# Patient Record
Sex: Female | Born: 1935 | Race: White | Hispanic: No | Smoking: Never smoker
Health system: Southern US, Community
[De-identification: ages and names within clinical notes are randomized; demographics above are authoritative.]

## PROBLEM LIST (undated history)

## (undated) DIAGNOSIS — Z9289 Personal history of other medical treatment: Secondary | ICD-10-CM

## (undated) DIAGNOSIS — E039 Hypothyroidism, unspecified: Secondary | ICD-10-CM

## (undated) DIAGNOSIS — K219 Gastro-esophageal reflux disease without esophagitis: Secondary | ICD-10-CM

## (undated) DIAGNOSIS — E7800 Pure hypercholesterolemia, unspecified: Secondary | ICD-10-CM

## (undated) DIAGNOSIS — F419 Anxiety disorder, unspecified: Secondary | ICD-10-CM

## (undated) DIAGNOSIS — M1990 Unspecified osteoarthritis, unspecified site: Secondary | ICD-10-CM

## (undated) HISTORY — DX: Pure hypercholesterolemia, unspecified: E78.00

## (undated) HISTORY — PX: RECTAL SURGERY: SHX760

## (undated) HISTORY — PX: KNEE SURGERY: SHX244

## (undated) HISTORY — PX: JOINT REPLACEMENT: SHX530

## (undated) HISTORY — PX: RECTOCELE REPAIR: SHX761

## (undated) HISTORY — PX: BLADDER SUSPENSION: SHX72

## (undated) HISTORY — PX: SHOULDER ARTHROSCOPY: SHX128

## (undated) HISTORY — PX: EYE SURGERY: SHX253

---

## 1962-05-22 HISTORY — PX: VAGINAL HYSTERECTOMY: SUR661

## 1983-05-23 HISTORY — PX: BREAST BIOPSY: SHX20

## 2004-11-10 ENCOUNTER — Ambulatory Visit: Payer: Self-pay | Admitting: Internal Medicine

## 2005-10-24 ENCOUNTER — Ambulatory Visit: Payer: Self-pay | Admitting: Internal Medicine

## 2005-11-15 ENCOUNTER — Ambulatory Visit: Payer: Self-pay | Admitting: Unknown Physician Specialty

## 2005-12-07 ENCOUNTER — Ambulatory Visit: Payer: Self-pay | Admitting: Internal Medicine

## 2006-01-15 ENCOUNTER — Ambulatory Visit: Payer: Self-pay | Admitting: Gastroenterology

## 2006-02-14 ENCOUNTER — Ambulatory Visit: Payer: Self-pay | Admitting: Gastroenterology

## 2006-05-07 ENCOUNTER — Ambulatory Visit: Payer: Self-pay | Admitting: Internal Medicine

## 2006-12-13 ENCOUNTER — Ambulatory Visit: Payer: Self-pay | Admitting: Internal Medicine

## 2007-08-14 ENCOUNTER — Ambulatory Visit: Payer: Self-pay

## 2007-09-12 ENCOUNTER — Ambulatory Visit: Payer: Self-pay | Admitting: Orthopaedic Surgery

## 2007-09-12 ENCOUNTER — Other Ambulatory Visit: Payer: Self-pay

## 2007-09-19 ENCOUNTER — Ambulatory Visit: Payer: Self-pay | Admitting: Orthopaedic Surgery

## 2007-12-16 ENCOUNTER — Ambulatory Visit: Payer: Self-pay | Admitting: Internal Medicine

## 2008-01-16 ENCOUNTER — Ambulatory Visit: Payer: Self-pay | Admitting: Ophthalmology

## 2008-12-17 ENCOUNTER — Ambulatory Visit: Payer: Self-pay | Admitting: Internal Medicine

## 2009-12-20 ENCOUNTER — Ambulatory Visit: Payer: Self-pay | Admitting: Internal Medicine

## 2010-01-03 ENCOUNTER — Ambulatory Visit: Payer: Self-pay | Admitting: Unknown Physician Specialty

## 2010-02-02 ENCOUNTER — Ambulatory Visit: Payer: Self-pay | Admitting: Anesthesiology

## 2010-02-21 ENCOUNTER — Ambulatory Visit: Payer: Self-pay | Admitting: Anesthesiology

## 2010-03-30 ENCOUNTER — Ambulatory Visit: Payer: Self-pay | Admitting: Anesthesiology

## 2010-06-14 ENCOUNTER — Ambulatory Visit: Payer: Self-pay | Admitting: Otolaryngology

## 2010-10-05 ENCOUNTER — Ambulatory Visit: Payer: Self-pay | Admitting: Anesthesiology

## 2010-12-27 ENCOUNTER — Ambulatory Visit: Payer: Self-pay | Admitting: Internal Medicine

## 2010-12-27 ENCOUNTER — Ambulatory Visit: Payer: Self-pay | Admitting: Anesthesiology

## 2011-03-03 ENCOUNTER — Ambulatory Visit: Payer: Self-pay | Admitting: Anesthesiology

## 2011-04-04 ENCOUNTER — Ambulatory Visit: Payer: Self-pay | Admitting: Unknown Physician Specialty

## 2011-06-30 ENCOUNTER — Ambulatory Visit: Payer: Self-pay | Admitting: Anesthesiology

## 2011-10-10 ENCOUNTER — Ambulatory Visit: Payer: Self-pay | Admitting: Anesthesiology

## 2012-01-11 ENCOUNTER — Ambulatory Visit: Payer: Self-pay | Admitting: Internal Medicine

## 2012-02-07 ENCOUNTER — Emergency Department: Payer: Self-pay | Admitting: *Deleted

## 2012-02-07 LAB — CBC WITH DIFFERENTIAL/PLATELET
Basophil %: 0.7 %
Eosinophil #: 0.1 10*3/uL (ref 0.0–0.7)
Eosinophil %: 1.8 %
HCT: 40.5 % (ref 35.0–47.0)
HGB: 14 g/dL (ref 12.0–16.0)
Lymphocyte #: 1.6 10*3/uL (ref 1.0–3.6)
Lymphocyte %: 24.6 %
MCH: 31.8 pg (ref 26.0–34.0)
MCHC: 34.6 g/dL (ref 32.0–36.0)
MCV: 92 fL (ref 80–100)
Neutrophil #: 4.2 10*3/uL (ref 1.4–6.5)
Neutrophil %: 62.6 %
RBC: 4.42 10*6/uL (ref 3.80–5.20)

## 2012-02-07 LAB — COMPREHENSIVE METABOLIC PANEL
Albumin: 4.1 g/dL (ref 3.4–5.0)
Alkaline Phosphatase: 84 U/L (ref 50–136)
Anion Gap: 10 (ref 7–16)
Chloride: 95 mmol/L — ABNORMAL LOW (ref 98–107)
Co2: 30 mmol/L (ref 21–32)
Creatinine: 1.06 mg/dL (ref 0.60–1.30)
EGFR (African American): 57 — ABNORMAL LOW
Osmolality: 271 (ref 275–301)
Potassium: 3.2 mmol/L — ABNORMAL LOW (ref 3.5–5.1)
SGPT (ALT): 50 U/L (ref 12–78)
Sodium: 135 mmol/L — ABNORMAL LOW (ref 136–145)
Total Protein: 7.5 g/dL (ref 6.4–8.2)

## 2012-02-07 LAB — URINALYSIS, COMPLETE
Bacteria: NONE SEEN
Bilirubin,UR: NEGATIVE
Blood: NEGATIVE
Glucose,UR: NEGATIVE mg/dL (ref 0–75)
Ketone: NEGATIVE
Ph: 8 (ref 4.5–8.0)
RBC,UR: 1 /HPF (ref 0–5)
Specific Gravity: 1.011 (ref 1.003–1.030)
Squamous Epithelial: NONE SEEN

## 2012-03-26 ENCOUNTER — Ambulatory Visit: Payer: Self-pay | Admitting: General Practice

## 2012-03-26 DIAGNOSIS — I10 Essential (primary) hypertension: Secondary | ICD-10-CM

## 2012-03-26 LAB — APTT: Activated PTT: 30.2 secs (ref 23.6–35.9)

## 2012-03-26 LAB — CBC
MCH: 30.7 pg (ref 26.0–34.0)
MCHC: 33.9 g/dL (ref 32.0–36.0)
Platelet: 207 10*3/uL (ref 150–440)
RBC: 4.68 10*6/uL (ref 3.80–5.20)
RDW: 13.5 % (ref 11.5–14.5)

## 2012-03-26 LAB — BASIC METABOLIC PANEL
Anion Gap: 9 (ref 7–16)
BUN: 14 mg/dL (ref 7–18)
Calcium, Total: 9.4 mg/dL (ref 8.5–10.1)
EGFR (African American): >60
EGFR (Non-African Amer.): >60
Potassium: 3.6 mmol/L (ref 3.5–5.1)

## 2012-03-26 LAB — MRSA PCR SCREENING

## 2012-03-26 LAB — URINALYSIS, COMPLETE
Bilirubin,UR: NEGATIVE
Blood: NEGATIVE
Leukocyte Esterase: NEGATIVE
Nitrite: NEGATIVE
Protein: NEGATIVE
Specific Gravity: 1.008 (ref 1.003–1.030)

## 2012-03-26 LAB — PROTIME-INR: Prothrombin Time: 12.5 secs (ref 11.5–14.7)

## 2012-03-26 LAB — SEDIMENTATION RATE: Erythrocyte Sed Rate: 12 mm/hr (ref 0–30)

## 2012-04-08 ENCOUNTER — Inpatient Hospital Stay: Payer: Self-pay | Admitting: General Practice

## 2012-04-09 LAB — PLATELET COUNT: Platelet: 147 10*3/uL — ABNORMAL LOW (ref 150–440)

## 2012-04-09 LAB — BASIC METABOLIC PANEL
Anion Gap: 4 — ABNORMAL LOW (ref 7–16)
BUN: 8 mg/dL (ref 7–18)
Calcium, Total: 8.7 mg/dL (ref 8.5–10.1)
Chloride: 104 mmol/L (ref 98–107)
EGFR (Non-African Amer.): >60
Osmolality: 275 (ref 275–301)
Potassium: 3.3 mmol/L — ABNORMAL LOW (ref 3.5–5.1)

## 2012-04-10 LAB — BASIC METABOLIC PANEL
BUN: 6 mg/dL — ABNORMAL LOW (ref 7–18)
Chloride: 103 mmol/L (ref 98–107)
Creatinine: 0.62 mg/dL (ref 0.60–1.30)
EGFR (African American): >60
EGFR (Non-African Amer.): >60
Sodium: 137 mmol/L (ref 136–145)

## 2012-04-10 LAB — HEMOGLOBIN: HGB: 12.7 g/dL (ref 12.0–16.0)

## 2012-04-10 LAB — PLATELET COUNT: Platelet: 155 10*3/uL (ref 150–440)

## 2012-04-11 ENCOUNTER — Encounter: Payer: Self-pay | Admitting: Internal Medicine

## 2012-04-11 LAB — BASIC METABOLIC PANEL
Anion Gap: 4 — ABNORMAL LOW (ref 7–16)
BUN: 9 mg/dL (ref 7–18)
Calcium, Total: 8.8 mg/dL (ref 8.5–10.1)
Co2: 31 mmol/L (ref 21–32)
EGFR (African American): >60
EGFR (Non-African Amer.): >60
Glucose: 91 mg/dL (ref 65–99)
Osmolality: 276 (ref 275–301)
Sodium: 139 mmol/L (ref 136–145)

## 2012-04-21 ENCOUNTER — Encounter: Payer: Self-pay | Admitting: Internal Medicine

## 2012-08-07 ENCOUNTER — Ambulatory Visit: Payer: Self-pay | Admitting: Anesthesiology

## 2012-10-28 ENCOUNTER — Ambulatory Visit: Payer: Self-pay | Admitting: Anesthesiology

## 2013-01-13 ENCOUNTER — Ambulatory Visit: Payer: Self-pay | Admitting: Internal Medicine

## 2013-01-16 ENCOUNTER — Ambulatory Visit: Payer: Self-pay | Admitting: Anesthesiology

## 2013-03-18 ENCOUNTER — Ambulatory Visit: Payer: Self-pay | Admitting: Anesthesiology

## 2013-04-21 ENCOUNTER — Encounter: Payer: Self-pay | Admitting: Podiatry

## 2013-04-21 ENCOUNTER — Ambulatory Visit (INDEPENDENT_AMBULATORY_CARE_PROVIDER_SITE_OTHER): Payer: Medicare Other | Admitting: Podiatry

## 2013-04-21 VITALS — BP 116/59 | HR 77 | Resp 16 | Ht 62.0 in | Wt 146.0 lb

## 2013-04-21 DIAGNOSIS — B351 Tinea unguium: Secondary | ICD-10-CM

## 2013-04-21 DIAGNOSIS — L608 Other nail disorders: Secondary | ICD-10-CM

## 2013-04-21 NOTE — Progress Notes (Signed)
Pamela Moore is an 79 year old white female who presents today one suggestion of her hairstylist regarding a blue toenail hallux left and a thickening toenail to the hallux right. He states that these been present for the past 2-3 months now denies any trauma to the foot this seems to be worsening.  Objective: I have reviewed her past medical history her medications or allergies her review of systems. Vital signs are stable she is alert and oriented x3. Pulses remain palpable bilateral and neurologic sensorium is intact. Deep tendon reflexes are equal bilateral. Muscle strength is normal is 5 over 5 dorsiflexors plantar flexors inverters and evertors with all intrinsic musculature intact. Cutaneous evaluation demonstrates supple well hydrated cutis subungual hematoma left hallux. Mild discoloration to the very distal aspect of the nail plate hallux right probable nail dystrophy. Orthopedic evaluation demonstrates rectus foot type bilateral mild flexible hammertoe deformities noted bilateral.  Assessment: Subungual hematoma hallux left. Nail dystrophy hallux right.  Plan: Since samples of her nail and skin out today for a mycologic evaluation. The samples came from the hallux bilateral and I will followup with her once this come in.

## 2013-04-21 NOTE — Progress Notes (Signed)
N O L B/L GREAT TOENAILS D 2-42M O SLOWLY C WORSE A 0 T 0

## 2013-05-07 ENCOUNTER — Ambulatory Visit: Payer: Self-pay | Admitting: Anesthesiology

## 2013-05-16 ENCOUNTER — Telehealth: Payer: Self-pay | Admitting: Podiatry

## 2013-05-16 NOTE — Telephone Encounter (Signed)
PT CALLED QUESTION ABOUT HER TOENAIL LAB RESULT. NOTIFY THE PT. THE RESULT IS NOT IN YET, BUT WILL GIVE HER A CALL AS SOON WE RECEIVED THE RESULT.

## 2013-05-19 ENCOUNTER — Encounter: Payer: Self-pay | Admitting: Podiatry

## 2013-05-21 ENCOUNTER — Encounter: Payer: Self-pay | Admitting: Podiatry

## 2013-05-21 ENCOUNTER — Ambulatory Visit (INDEPENDENT_AMBULATORY_CARE_PROVIDER_SITE_OTHER): Payer: Medicare Other | Admitting: Podiatry

## 2013-05-21 VITALS — BP 131/74 | HR 93 | Resp 16 | Ht 61.0 in | Wt 145.0 lb

## 2013-05-21 DIAGNOSIS — Z79899 Other long term (current) drug therapy: Secondary | ICD-10-CM

## 2013-05-21 MED ORDER — EFINACONAZOLE 10 % EX SOLN: 1.0000 [drp] | Freq: Every day | CUTANEOUS | Status: DC

## 2013-05-21 NOTE — Progress Notes (Signed)
She presents today for followup of her lab results which did come back positive for small amount of fungus with a dystrophic hallux nail bilateral.  Objective: Vital signs are stable she is alert and oriented x3 onychomycosis to the hallux nail plates bilateral a subungual onychomycosis and subungual hematoma hallux left.  Assessment: Onychomycosis bilateral hallux.  Plan: Started her on Jublieea to be applied twice a day to each great nail and I will followup with her in several months

## 2013-06-24 ENCOUNTER — Telehealth: Payer: Self-pay | Admitting: *Deleted

## 2013-06-24 NOTE — Telephone Encounter (Signed)
Pt called said she had not receiver her refill for jublia. Pt said she had called pharmacy to request it but had not received it and that the pharmacy said that we needed to contact them. She had called the pharmacy again and this time asked the pharmacy to call us. Pharmacy contacted [place], but when i called them back it was not [facility], it was a pharmacy in [place]. I then contacted [facility] and asked them to send refill to pt. They said they would do that. I then called the pt back and informed her she had contacted the wrong pharmacy and gave her the correct information to [facility] and told her that they will be sending her refill to her. Pt understood.

## 2013-07-22 ENCOUNTER — Encounter: Payer: Self-pay | Admitting: *Deleted

## 2013-07-22 ENCOUNTER — Telehealth: Payer: Self-pay | Admitting: *Deleted

## 2013-07-22 NOTE — Telephone Encounter (Signed)
Called and spoke with pt letting her know dr Milinda Pointer said one pill per day of the jublia was fine. Pt understood.

## 2013-07-22 NOTE — Telephone Encounter (Signed)
Open in error

## 2013-07-22 NOTE — Telephone Encounter (Signed)
Pt called said you had given her jublia. Said she was using it twice daily and she began to itch all over. Stated she cut it back to once daily and everything seems to be doing well. She wants to know if this is ok and is this  going to be effective?

## 2013-07-22 NOTE — Telephone Encounter (Signed)
That will be fine. 

## 2013-09-01 ENCOUNTER — Ambulatory Visit: Payer: Self-pay | Admitting: Anesthesiology

## 2013-09-16 ENCOUNTER — Other Ambulatory Visit: Payer: Self-pay | Admitting: *Deleted

## 2013-09-16 ENCOUNTER — Telehealth: Payer: Self-pay | Admitting: *Deleted

## 2013-09-16 MED ORDER — EFINACONAZOLE 10 % EX SOLN: 1.0000 [drp] | Freq: Every day | CUTANEOUS | Status: DC

## 2013-09-16 NOTE — Telephone Encounter (Signed)
Called patient to let her know i did go ahead and refilled the jublia with another pharmacy let us know if she does not hear from them

## 2013-09-16 NOTE — Telephone Encounter (Signed)
Needed a refill for jublia

## 2013-09-18 ENCOUNTER — Ambulatory Visit: Payer: Self-pay | Admitting: Anesthesiology

## 2013-11-17 ENCOUNTER — Ambulatory Visit: Payer: Medicare Other | Admitting: Podiatry

## 2013-11-26 ENCOUNTER — Ambulatory Visit: Payer: Self-pay | Admitting: Anesthesiology

## 2013-11-28 DIAGNOSIS — M519 Unspecified thoracic, thoracolumbar and lumbosacral intervertebral disc disorder: Secondary | ICD-10-CM | POA: Insufficient documentation

## 2013-12-07 ENCOUNTER — Emergency Department: Payer: Self-pay | Admitting: Emergency Medicine

## 2013-12-07 LAB — COMPREHENSIVE METABOLIC PANEL
ALBUMIN: 3.7 g/dL (ref 3.4–5.0)
Alkaline Phosphatase: 103 U/L
Anion Gap: 7 (ref 7–16)
BILIRUBIN TOTAL: 0.4 mg/dL (ref 0.2–1.0)
BUN: 14 mg/dL (ref 7–18)
CALCIUM: 9.1 mg/dL (ref 8.5–10.1)
CO2: 31 mmol/L (ref 21–32)
Chloride: 97 mmol/L — ABNORMAL LOW (ref 98–107)
Creatinine: 0.85 mg/dL (ref 0.60–1.30)
EGFR (Non-African Amer.): >60
GLUCOSE: 95 mg/dL (ref 65–99)
Osmolality: 270 (ref 275–301)
POTASSIUM: 3.1 mmol/L — AB (ref 3.5–5.1)
SGOT(AST): 42 U/L — ABNORMAL HIGH (ref 15–37)
SGPT (ALT): 38 U/L (ref 12–78)
Sodium: 135 mmol/L — ABNORMAL LOW (ref 136–145)
Total Protein: 7.3 g/dL (ref 6.4–8.2)

## 2013-12-07 LAB — DRUG SCREEN, URINE
Amphetamines, Ur Screen: NEGATIVE (ref ?–1000)
Barbiturates, Ur Screen: NEGATIVE (ref ?–200)
Benzodiazepine, Ur Scrn: NEGATIVE (ref ?–200)
COCAINE METABOLITE, UR ~~LOC~~: NEGATIVE (ref ?–300)
Cannabinoid 50 Ng, Ur ~~LOC~~: NEGATIVE (ref ?–50)
MDMA (ECSTASY) UR SCREEN: NEGATIVE (ref ?–500)
METHADONE, UR SCREEN: NEGATIVE (ref ?–300)
OPIATE, UR SCREEN: NEGATIVE (ref ?–300)
Phencyclidine (PCP) Ur S: NEGATIVE (ref ?–25)
TRICYCLIC, UR SCREEN: NEGATIVE (ref ?–1000)

## 2013-12-07 LAB — ETHANOL: Ethanol %: <0.003 % (ref 0.000–0.080)

## 2013-12-07 LAB — TROPONIN I
Troponin-I: 0.06 ng/mL — ABNORMAL HIGH
Troponin-I: 0.06 ng/mL — ABNORMAL HIGH

## 2013-12-07 LAB — URINALYSIS, COMPLETE
BLOOD: NEGATIVE
Bacteria: NONE SEEN
Bilirubin,UR: NEGATIVE
GLUCOSE, UR: NEGATIVE mg/dL (ref 0–75)
Ketone: NEGATIVE
LEUKOCYTE ESTERASE: NEGATIVE
Nitrite: NEGATIVE
PH: 7 (ref 4.5–8.0)
PROTEIN: NEGATIVE
RBC, UR: NONE SEEN /HPF (ref 0–5)
Specific Gravity: 1.003 (ref 1.003–1.030)
Squamous Epithelial: NONE SEEN

## 2013-12-07 LAB — CBC
HCT: 41.6 % (ref 35.0–47.0)
HGB: 13.5 g/dL (ref 12.0–16.0)
MCH: 29.2 pg (ref 26.0–34.0)
MCHC: 32.4 g/dL (ref 32.0–36.0)
MCV: 90 fL (ref 80–100)
PLATELETS: 236 10*3/uL (ref 150–440)
RBC: 4.61 10*6/uL (ref 3.80–5.20)
RDW: 15.1 % — ABNORMAL HIGH (ref 11.5–14.5)
WBC: 6.1 10*3/uL (ref 3.6–11.0)

## 2014-03-12 ENCOUNTER — Ambulatory Visit: Payer: Self-pay | Admitting: Internal Medicine

## 2014-06-02 ENCOUNTER — Other Ambulatory Visit (HOSPITAL_COMMUNITY): Payer: Self-pay | Admitting: Neurosurgery

## 2014-06-22 ENCOUNTER — Encounter (HOSPITAL_COMMUNITY): Payer: Self-pay

## 2014-06-22 ENCOUNTER — Encounter (HOSPITAL_COMMUNITY)
Admission: RE | Admit: 2014-06-22 | Discharge: 2014-06-22 | Disposition: A | Discharge status: Home / Self Care | Payer: PPO | Source: Ambulatory Visit | Attending: Neurosurgery | Admitting: Neurosurgery

## 2014-06-22 DIAGNOSIS — M4806 Spinal stenosis, lumbar region: Secondary | ICD-10-CM | POA: Diagnosis not present

## 2014-06-22 DIAGNOSIS — Z01812 Encounter for preprocedural laboratory examination: Secondary | ICD-10-CM | POA: Insufficient documentation

## 2014-06-22 DIAGNOSIS — Z0183 Encounter for blood typing: Secondary | ICD-10-CM | POA: Diagnosis not present

## 2014-06-22 HISTORY — DX: Hypothyroidism, unspecified: E03.9

## 2014-06-22 HISTORY — DX: Anxiety disorder, unspecified: F41.9

## 2014-06-22 HISTORY — DX: Gastro-esophageal reflux disease without esophagitis: K21.9

## 2014-06-22 HISTORY — DX: Personal history of other medical treatment: Z92.89

## 2014-06-22 HISTORY — DX: Unspecified osteoarthritis, unspecified site: M19.90

## 2014-06-22 LAB — TYPE AND SCREEN
ABO/RH(D): A POS
Antibody Screen: NEGATIVE

## 2014-06-22 LAB — ABO/RH: ABO/RH(D): A POS

## 2014-06-22 LAB — CBC
HEMATOCRIT: 40.9 % (ref 36.0–46.0)
Hemoglobin: 14 g/dL (ref 12.0–15.0)
MCH: 30.4 pg (ref 26.0–34.0)
MCHC: 34.2 g/dL (ref 30.0–36.0)
MCV: 88.9 fL (ref 78.0–100.0)
PLATELETS: 216 10*3/uL (ref 150–400)
RBC: 4.6 MIL/uL (ref 3.87–5.11)
RDW: 12.7 % (ref 11.5–15.5)
WBC: 6.4 10*3/uL (ref 4.0–10.5)

## 2014-06-22 LAB — SURGICAL PCR SCREEN
MRSA, PCR: NEGATIVE
Staphylococcus aureus: NEGATIVE

## 2014-06-22 LAB — BASIC METABOLIC PANEL
Anion gap: 8 (ref 5–15)
BUN: 13 mg/dL (ref 6–23)
CALCIUM: 9.6 mg/dL (ref 8.4–10.5)
CO2: 29 mmol/L (ref 19–32)
Chloride: 98 mmol/L (ref 96–112)
Creatinine, Ser: 0.91 mg/dL (ref 0.50–1.10)
GFR calc non Af Amer: 56 mL/min — ABNORMAL LOW (ref >90)
GFR, EST AFRICAN AMERICAN: 65 mL/min — AB (ref >90)
Glucose, Bld: 96 mg/dL (ref 70–99)
Potassium: 3.6 mmol/L (ref 3.5–5.1)
Sodium: 135 mmol/L (ref 135–145)

## 2014-06-22 NOTE — Pre-Procedure Instructions (Addendum)
Pamela Moore  06/22/2014   Your procedure is scheduled on: Monday, February 8.  Report to [facility] Admitting at 5:30AM.  Call this number if you have problems the morning of surgery: [phone]                For any other questions, please call [phone], Monday - Friday 8 AM - 4 PM.  Remember:   Do not eat food or drink liquids after midnight Sunday, February 7.   Take these medicines the morning of surgery with A SIP OF WATER: levothyroxine (SYNTHROID, LEVOTHROID), omeprazole (PRILOSEC).               Stop taking Aspirin, Coumadin, Plavix, Effient and Herbal medications and Vitamins.  Do not take any NSAIDs PV:VZSMOLMBE (MOBIC,  Ibuprofen,  Advil,Naproxen or any medication containing Aspirin.    Do not wear jewelry, make-up or nail polish.  Do not wear lotions, powders, or perfumes.  Do not shave 48 hours prior to surgery.  Do not bring valuables to the hospital.             [facility] is not responsible for any belongings or valuables.               Contacts, dentures or bridgework may not be worn into surgery.  Leave suitcase in the car. After surgery it may be brought to your room.  For patients admitted to the hospital, discharge time is determined by your treatment team.              Special Instructions: .Review  [facility] - Preparing For Surgery.   Please read over the following fact sheets that you were given: Pain Booklet, Coughing and Deep Breathing, Blood Transfusion Information and MRSA Information

## 2014-06-28 MED ORDER — CEFAZOLIN SODIUM-DEXTROSE 2-3 GM-% IV SOLR
2.0000 g | INTRAVENOUS | Status: DC
  Filled 2014-06-28: qty 50

## 2014-06-29 ENCOUNTER — Inpatient Hospital Stay (HOSPITAL_COMMUNITY)
Admission: RE | Admit: 2014-06-29 | Discharge: 2014-07-01 | DRG: 460 | Disposition: A | Discharge status: Skilled Nursing Facility | Payer: PPO | Source: Ambulatory Visit | Attending: Neurosurgery | Admitting: Neurosurgery

## 2014-06-29 ENCOUNTER — Inpatient Hospital Stay (HOSPITAL_COMMUNITY): Payer: PPO | Admitting: Anesthesiology

## 2014-06-29 ENCOUNTER — Encounter (HOSPITAL_COMMUNITY): Payer: Self-pay | Admitting: *Deleted

## 2014-06-29 ENCOUNTER — Inpatient Hospital Stay (HOSPITAL_COMMUNITY): Payer: PPO

## 2014-06-29 ENCOUNTER — Encounter (HOSPITAL_COMMUNITY)
Admission: RE | Disposition: A | Discharge status: Skilled Nursing Facility | Payer: Medicare Other | Source: Ambulatory Visit | Attending: Neurosurgery

## 2014-06-29 DIAGNOSIS — M4316 Spondylolisthesis, lumbar region: Principal | ICD-10-CM | POA: Diagnosis present

## 2014-06-29 DIAGNOSIS — E780 Pure hypercholesterolemia: Secondary | ICD-10-CM | POA: Diagnosis present

## 2014-06-29 DIAGNOSIS — Z7982 Long term (current) use of aspirin: Secondary | ICD-10-CM | POA: Diagnosis not present

## 2014-06-29 DIAGNOSIS — E039 Hypothyroidism, unspecified: Secondary | ICD-10-CM | POA: Diagnosis present

## 2014-06-29 DIAGNOSIS — F419 Anxiety disorder, unspecified: Secondary | ICD-10-CM | POA: Diagnosis present

## 2014-06-29 DIAGNOSIS — M48062 Spinal stenosis, lumbar region with neurogenic claudication: Secondary | ICD-10-CM | POA: Diagnosis present

## 2014-06-29 DIAGNOSIS — M549 Dorsalgia, unspecified: Secondary | ICD-10-CM | POA: Diagnosis present

## 2014-06-29 DIAGNOSIS — M4806 Spinal stenosis, lumbar region: Secondary | ICD-10-CM | POA: Diagnosis present

## 2014-06-29 DIAGNOSIS — K219 Gastro-esophageal reflux disease without esophagitis: Secondary | ICD-10-CM | POA: Diagnosis present

## 2014-06-29 DIAGNOSIS — M5416 Radiculopathy, lumbar region: Secondary | ICD-10-CM | POA: Diagnosis present

## 2014-06-29 DIAGNOSIS — M48061 Spinal stenosis, lumbar region without neurogenic claudication: Secondary | ICD-10-CM

## 2014-06-29 SURGERY — POSTERIOR LUMBAR FUSION 1 LEVEL
Anesthesia: General | Site: Spine Lumbar

## 2014-06-29 MED ORDER — FLUTICASONE PROPIONATE 50 MCG/ACT NA SUSP
2.0000 | Freq: Every day | NASAL | Status: DC
Start: 2014-06-29 — End: 2014-07-01
  Administered 2014-06-29 – 2014-07-01 (×3): 2 via NASAL
  Filled 2014-06-29: qty 16

## 2014-06-29 MED ORDER — KETOROLAC TROMETHAMINE 15 MG/ML IJ SOLN
INTRAMUSCULAR | Status: AC
  Filled 2014-06-29: qty 1

## 2014-06-29 MED ORDER — FENTANYL CITRATE 0.05 MG/ML IJ SOLN
INTRAMUSCULAR | Status: DC | PRN
  Administered 2014-06-29 (×3): 50 ug via INTRAVENOUS
  Administered 2014-06-29: 25 ug via INTRAVENOUS
  Administered 2014-06-29 (×3): 50 ug via INTRAVENOUS
  Administered 2014-06-29: 25 ug via INTRAVENOUS

## 2014-06-29 MED ORDER — HYDROMORPHONE HCL 1 MG/ML IJ SOLN: 0.2500 mg | INTRAMUSCULAR | Status: DC | PRN

## 2014-06-29 MED ORDER — ONDANSETRON HCL 4 MG/2ML IJ SOLN: 4.0000 mg | Freq: Once | INTRAMUSCULAR | Status: DC | PRN

## 2014-06-29 MED ORDER — DOCUSATE SODIUM 100 MG PO CAPS
100.0000 mg | ORAL_CAPSULE | Freq: Two times a day (BID) | ORAL | Status: DC
  Administered 2014-06-29 – 2014-07-01 (×4): 100 mg via ORAL
  Filled 2014-06-29 (×4): qty 1

## 2014-06-29 MED ORDER — GLYCOPYRROLATE 0.2 MG/ML IJ SOLN
INTRAMUSCULAR | Status: DC | PRN
  Administered 2014-06-29: 0.3 mg via INTRAVENOUS

## 2014-06-29 MED ORDER — ROCURONIUM BROMIDE 100 MG/10ML IV SOLN
INTRAVENOUS | Status: DC | PRN
  Administered 2014-06-29: 50 mg via INTRAVENOUS

## 2014-06-29 MED ORDER — ALUM & MAG HYDROXIDE-SIMETH 200-200-20 MG/5ML PO SUSP: 30.0000 mL | Freq: Four times a day (QID) | ORAL | Status: DC | PRN

## 2014-06-29 MED ORDER — LABETALOL HCL 5 MG/ML IV SOLN
5.0000 mg | Freq: Once | INTRAVENOUS | Status: AC
  Administered 2014-06-29: 5 mg via INTRAVENOUS

## 2014-06-29 MED ORDER — FENTANYL CITRATE 0.05 MG/ML IJ SOLN
INTRAMUSCULAR | Status: AC
Start: 2014-06-29 — End: 2014-06-29
  Filled 2014-06-29: qty 5

## 2014-06-29 MED ORDER — PHENYLEPHRINE HCL 10 MG/ML IJ SOLN
INTRAMUSCULAR | Status: DC | PRN
  Administered 2014-06-29 (×5): 40 ug via INTRAVENOUS

## 2014-06-29 MED ORDER — LABETALOL HCL 5 MG/ML IV SOLN
INTRAVENOUS | Status: AC
  Administered 2014-06-29: 5 mg via INTRAVENOUS
  Filled 2014-06-29: qty 4

## 2014-06-29 MED ORDER — SIMVASTATIN 40 MG PO TABS
40.0000 mg | ORAL_TABLET | Freq: Every day | ORAL | Status: DC
  Administered 2014-06-29 – 2014-06-30 (×2): 40 mg via ORAL
  Filled 2014-06-29 (×3): qty 1

## 2014-06-29 MED ORDER — FENTANYL CITRATE 0.05 MG/ML IJ SOLN
INTRAMUSCULAR | Status: AC
  Filled 2014-06-29: qty 5

## 2014-06-29 MED ORDER — HYDROCODONE-ACETAMINOPHEN 5-325 MG PO TABS
1.0000 | ORAL_TABLET | ORAL | Status: DC | PRN
  Administered 2014-06-29 – 2014-06-30 (×4): 2 via ORAL
  Administered 2014-07-01: 1 via ORAL
  Filled 2014-06-29 (×4): qty 2
  Filled 2014-06-29: qty 1

## 2014-06-29 MED ORDER — SODIUM CHLORIDE 0.9 % IJ SOLN: 3.0000 mL | INTRAMUSCULAR | Status: DC | PRN

## 2014-06-29 MED ORDER — NEOSTIGMINE METHYLSULFATE 10 MG/10ML IV SOLN
INTRAVENOUS | Status: DC | PRN
Start: 2014-06-29 — End: 2014-06-29
  Administered 2014-06-29: 1 mg via INTRAVENOUS
  Administered 2014-06-29: 2 mg via INTRAVENOUS

## 2014-06-29 MED ORDER — MENTHOL 3 MG MT LOZG: 1.0000 | LOZENGE | OROMUCOSAL | Status: DC | PRN

## 2014-06-29 MED ORDER — THROMBIN 20000 UNITS EX SOLR
CUTANEOUS | Status: DC | PRN
  Administered 2014-06-29: 09:00:00 via TOPICAL

## 2014-06-29 MED ORDER — ACETAMINOPHEN 650 MG RE SUPP: 650.0000 mg | RECTAL | Status: DC | PRN

## 2014-06-29 MED ORDER — BISACODYL 10 MG RE SUPP: 10.0000 mg | Freq: Every day | RECTAL | Status: DC | PRN

## 2014-06-29 MED ORDER — MAGNESIUM HYDROXIDE 400 MG/5ML PO SUSP: 30.0000 mL | Freq: Every day | ORAL | Status: DC | PRN

## 2014-06-29 MED ORDER — HYDROMORPHONE HCL 1 MG/ML IJ SOLN
0.2500 mg | INTRAMUSCULAR | Status: DC | PRN
  Administered 2014-06-29 (×2): 0.5 mg via INTRAVENOUS

## 2014-06-29 MED ORDER — KCL IN DEXTROSE-NACL 20-5-0.45 MEQ/L-%-% IV SOLN
INTRAVENOUS | Status: DC
  Filled 2014-06-29 (×6): qty 1000

## 2014-06-29 MED ORDER — ONDANSETRON HCL 4 MG/2ML IJ SOLN: 4.0000 mg | Freq: Four times a day (QID) | INTRAMUSCULAR | Status: DC | PRN

## 2014-06-29 MED ORDER — THROMBIN 5000 UNITS EX SOLR
OROMUCOSAL | Status: DC | PRN
  Administered 2014-06-29: 09:00:00 via TOPICAL

## 2014-06-29 MED ORDER — 0.9 % SODIUM CHLORIDE (POUR BTL) OPTIME
TOPICAL | Status: DC | PRN
  Administered 2014-06-29: 1000 mL

## 2014-06-29 MED ORDER — ARTIFICIAL TEARS OP OINT
TOPICAL_OINTMENT | OPHTHALMIC | Status: DC | PRN
  Administered 2014-06-29: 1 via OPHTHALMIC

## 2014-06-29 MED ORDER — ROCURONIUM BROMIDE 50 MG/5ML IV SOLN
INTRAVENOUS | Status: AC
  Filled 2014-06-29: qty 1

## 2014-06-29 MED ORDER — HYDROMORPHONE HCL 1 MG/ML IJ SOLN
INTRAMUSCULAR | Status: AC
  Administered 2014-06-29: 0.5 mg via INTRAVENOUS
  Filled 2014-06-29: qty 1

## 2014-06-29 MED ORDER — ARTIFICIAL TEARS OP OINT
TOPICAL_OINTMENT | OPHTHALMIC | Status: AC
  Filled 2014-06-29: qty 3.5

## 2014-06-29 MED ORDER — ONDANSETRON HCL 4 MG/2ML IJ SOLN
INTRAMUSCULAR | Status: DC | PRN
  Administered 2014-06-29: 4 mg via INTRAVENOUS

## 2014-06-29 MED ORDER — CEFAZOLIN SODIUM-DEXTROSE 2-3 GM-% IV SOLR
INTRAVENOUS | Status: AC
  Filled 2014-06-29: qty 50

## 2014-06-29 MED ORDER — CEFAZOLIN SODIUM-DEXTROSE 2-3 GM-% IV SOLR
INTRAVENOUS | Status: DC | PRN
  Administered 2014-06-29 (×2): 2 g via INTRAVENOUS

## 2014-06-29 MED ORDER — OXYCODONE-ACETAMINOPHEN 5-325 MG PO TABS
1.0000 | ORAL_TABLET | ORAL | Status: DC | PRN
  Administered 2014-06-29: 2 via ORAL

## 2014-06-29 MED ORDER — SODIUM CHLORIDE 0.9 % IR SOLN
Status: DC | PRN
  Administered 2014-06-29: 09:00:00

## 2014-06-29 MED ORDER — GLYCOPYRROLATE 0.2 MG/ML IJ SOLN
INTRAMUSCULAR | Status: AC
  Filled 2014-06-29: qty 2

## 2014-06-29 MED ORDER — DEXTROSE 5 % IV SOLN
10.0000 mg | INTRAVENOUS | Status: DC | PRN
  Administered 2014-06-29: 10 ug/min via INTRAVENOUS

## 2014-06-29 MED ORDER — NEOSTIGMINE METHYLSULFATE 10 MG/10ML IV SOLN
INTRAVENOUS | Status: AC
  Filled 2014-06-29: qty 1

## 2014-06-29 MED ORDER — PROPOFOL 10 MG/ML IV BOLUS
INTRAVENOUS | Status: DC | PRN
  Administered 2014-06-29: 100 mg via INTRAVENOUS

## 2014-06-29 MED ORDER — TRIAMTERENE-HCTZ 37.5-25 MG PO TABS
1.0000 | ORAL_TABLET | Freq: Every day | ORAL | Status: DC
  Administered 2014-06-29 – 2014-07-01 (×3): 1 via ORAL
  Filled 2014-06-29 (×3): qty 1

## 2014-06-29 MED ORDER — LEVOTHYROXINE SODIUM 50 MCG PO TABS
50.0000 ug | ORAL_TABLET | Freq: Every day | ORAL | Status: DC
  Administered 2014-06-30 – 2014-07-01 (×2): 50 ug via ORAL
  Filled 2014-06-29 (×4): qty 1

## 2014-06-29 MED ORDER — CYCLOBENZAPRINE HCL 10 MG PO TABS
10.0000 mg | ORAL_TABLET | Freq: Three times a day (TID) | ORAL | Status: DC | PRN
  Administered 2014-06-30: 10 mg via ORAL
  Filled 2014-06-29: qty 1

## 2014-06-29 MED ORDER — PHENYLEPHRINE 40 MCG/ML (10ML) SYRINGE FOR IV PUSH (FOR BLOOD PRESSURE SUPPORT)
PREFILLED_SYRINGE | INTRAVENOUS | Status: AC
  Filled 2014-06-29: qty 10

## 2014-06-29 MED ORDER — SUCCINYLCHOLINE CHLORIDE 20 MG/ML IJ SOLN
INTRAMUSCULAR | Status: AC
  Filled 2014-06-29: qty 1

## 2014-06-29 MED ORDER — DONEPEZIL HCL 10 MG PO TABS
10.0000 mg | ORAL_TABLET | Freq: Every day | ORAL | Status: DC
  Administered 2014-06-29 – 2014-06-30 (×2): 10 mg via ORAL
  Filled 2014-06-29 (×3): qty 1

## 2014-06-29 MED ORDER — ACETAMINOPHEN 10 MG/ML IV SOLN
INTRAVENOUS | Status: AC
Start: 2014-06-29 — End: 2014-06-29
  Administered 2014-06-29: 1000 mg via INTRAVENOUS
  Filled 2014-06-29: qty 100

## 2014-06-29 MED ORDER — EPHEDRINE SULFATE 50 MG/ML IJ SOLN
INTRAMUSCULAR | Status: AC
  Filled 2014-06-29: qty 1

## 2014-06-29 MED ORDER — TEMAZEPAM 15 MG PO CAPS: 15.0000 mg | ORAL_CAPSULE | Freq: Every day | ORAL | Status: DC

## 2014-06-29 MED ORDER — VECURONIUM BROMIDE 10 MG IV SOLR
INTRAVENOUS | Status: AC
  Filled 2014-06-29: qty 10

## 2014-06-29 MED ORDER — SODIUM CHLORIDE 0.9 % IJ SOLN
INTRAMUSCULAR | Status: AC
  Filled 2014-06-29: qty 10

## 2014-06-29 MED ORDER — BUPIVACAINE HCL (PF) 0.5 % IJ SOLN
INTRAMUSCULAR | Status: DC | PRN
  Administered 2014-06-29: 16.5 mL

## 2014-06-29 MED ORDER — ONDANSETRON HCL 4 MG/2ML IJ SOLN
INTRAMUSCULAR | Status: AC
  Filled 2014-06-29: qty 2

## 2014-06-29 MED ORDER — SERTRALINE HCL 50 MG PO TABS
50.0000 mg | ORAL_TABLET | Freq: Every day | ORAL | Status: DC
Start: 2014-06-29 — End: 2014-07-01
  Administered 2014-06-29 – 2014-06-30 (×2): 50 mg via ORAL
  Filled 2014-06-29 (×3): qty 1

## 2014-06-29 MED ORDER — LIDOCAINE-EPINEPHRINE 1 %-1:100000 IJ SOLN
INTRAMUSCULAR | Status: DC | PRN
  Administered 2014-06-29: 16.5 mL

## 2014-06-29 MED ORDER — ACETAMINOPHEN 325 MG PO TABS
650.0000 mg | ORAL_TABLET | ORAL | Status: DC | PRN
  Administered 2014-06-30 – 2014-07-01 (×3): 650 mg via ORAL
  Filled 2014-06-29 (×3): qty 2

## 2014-06-29 MED ORDER — LACTATED RINGERS IV SOLN
INTRAVENOUS | Status: DC | PRN
  Administered 2014-06-29 (×2): via INTRAVENOUS

## 2014-06-29 MED ORDER — MIDAZOLAM HCL 5 MG/5ML IJ SOLN
INTRAMUSCULAR | Status: DC | PRN
  Administered 2014-06-29 (×2): 1 mg via INTRAVENOUS

## 2014-06-29 MED ORDER — PROPOFOL 10 MG/ML IV BOLUS
INTRAVENOUS | Status: AC
  Filled 2014-06-29: qty 20

## 2014-06-29 MED ORDER — LIDOCAINE HCL (CARDIAC) 20 MG/ML IV SOLN
INTRAVENOUS | Status: DC | PRN
  Administered 2014-06-29: 100 mg via INTRAVENOUS

## 2014-06-29 MED ORDER — ZOLPIDEM TARTRATE 5 MG PO TABS: 5.0000 mg | ORAL_TABLET | Freq: Every evening | ORAL | Status: DC | PRN

## 2014-06-29 MED ORDER — HYDROXYZINE HCL 25 MG PO TABS
50.0000 mg | ORAL_TABLET | ORAL | Status: DC | PRN
  Administered 2014-07-01: 50 mg via ORAL
  Filled 2014-06-29: qty 2

## 2014-06-29 MED ORDER — STERILE WATER FOR INJECTION IJ SOLN
INTRAMUSCULAR | Status: AC
  Filled 2014-06-29: qty 10

## 2014-06-29 MED ORDER — SODIUM CHLORIDE 0.9 % IJ SOLN
3.0000 mL | Freq: Two times a day (BID) | INTRAMUSCULAR | Status: DC
  Administered 2014-06-29 – 2014-06-30 (×2): 3 mL via INTRAVENOUS

## 2014-06-29 MED ORDER — LIDOCAINE HCL (CARDIAC) 20 MG/ML IV SOLN
INTRAVENOUS | Status: AC
  Filled 2014-06-29: qty 5

## 2014-06-29 MED ORDER — KETOROLAC TROMETHAMINE 30 MG/ML IJ SOLN
15.0000 mg | Freq: Four times a day (QID) | INTRAMUSCULAR | Status: AC
  Administered 2014-06-29 – 2014-06-30 (×5): 15 mg via INTRAVENOUS
  Filled 2014-06-29 (×7): qty 1

## 2014-06-29 MED ORDER — KETOROLAC TROMETHAMINE 30 MG/ML IJ SOLN
15.0000 mg | Freq: Once | INTRAMUSCULAR | Status: AC
  Administered 2014-06-29: 15 mg via INTRAVENOUS

## 2014-06-29 MED ORDER — MEPERIDINE HCL 25 MG/ML IJ SOLN: 6.2500 mg | INTRAMUSCULAR | Status: DC | PRN

## 2014-06-29 MED ORDER — SODIUM CHLORIDE 0.9 % IV SOLN: 250.0000 mL | INTRAVENOUS | Status: DC

## 2014-06-29 MED ORDER — PHENOL 1.4 % MT LIQD
1.0000 | OROMUCOSAL | Status: DC | PRN
Start: 2014-06-29 — End: 2014-07-01

## 2014-06-29 MED ORDER — OXYCODONE-ACETAMINOPHEN 5-325 MG PO TABS
ORAL_TABLET | ORAL | Status: AC
  Administered 2014-06-29: 2 via ORAL
  Filled 2014-06-29: qty 2

## 2014-06-29 MED ORDER — CYCLOBENZAPRINE HCL 10 MG PO TABS
ORAL_TABLET | ORAL | Status: AC
  Filled 2014-06-29: qty 1

## 2014-06-29 MED ORDER — MORPHINE SULFATE 4 MG/ML IJ SOLN: 4.0000 mg | INTRAMUSCULAR | Status: DC | PRN

## 2014-06-29 MED ORDER — MIDAZOLAM HCL 2 MG/2ML IJ SOLN
INTRAMUSCULAR | Status: AC
  Filled 2014-06-29: qty 2

## 2014-06-29 MED ORDER — FUROSEMIDE 20 MG PO TABS
20.0000 mg | ORAL_TABLET | Freq: Every day | ORAL | Status: DC
  Administered 2014-06-29 – 2014-07-01 (×3): 20 mg via ORAL
  Filled 2014-06-29 (×4): qty 1

## 2014-06-29 SURGICAL SUPPLY — 67 items
BAG DECANTER FOR FLEXI CONT (MISCELLANEOUS) ×2 IMPLANT
BLADE CLIPPER SURG (BLADE) IMPLANT
BRUSH SCRUB EZ PLAIN DRY (MISCELLANEOUS) ×2 IMPLANT
BUR ACRON 5.0MM COATED (BURR) ×2 IMPLANT
BUR MATCHSTICK NEURO 3.0 LAGG (BURR) ×2 IMPLANT
CANISTER SUCT 3000ML (MISCELLANEOUS) ×2 IMPLANT
CAP LCK SPNE (Orthopedic Implant) ×4 IMPLANT
CAP LOCK SPINE RADIUS (Orthopedic Implant) ×4 IMPLANT
CAP LOCKING (Orthopedic Implant) ×4 IMPLANT
CONT SPEC 4OZ CLIKSEAL STRL BL (MISCELLANEOUS) ×2 IMPLANT
COVER BACK TABLE 60X90IN (DRAPES) ×2 IMPLANT
DERMABOND ADVANCED (GAUZE/BANDAGES/DRESSINGS) ×2
DERMABOND ADVANCED .7 DNX12 (GAUZE/BANDAGES/DRESSINGS) ×2 IMPLANT
DRAPE C-ARM 42X72 X-RAY (DRAPES) ×4 IMPLANT
DRAPE LAPAROTOMY 100X72X124 (DRAPES) ×2 IMPLANT
DRAPE POUCH INSTRU U-SHP 10X18 (DRAPES) ×2 IMPLANT
DRAPE PROXIMA HALF (DRAPES) ×4 IMPLANT
DRSG EMULSION OIL 3X3 NADH (GAUZE/BANDAGES/DRESSINGS) IMPLANT
ELECT REM PT RETURN 9FT ADLT (ELECTROSURGICAL) ×2
ELECTRODE REM PT RTRN 9FT ADLT (ELECTROSURGICAL) ×1 IMPLANT
GAUZE SPONGE 4X4 12PLY STRL (GAUZE/BANDAGES/DRESSINGS) ×2 IMPLANT
GAUZE SPONGE 4X4 16PLY XRAY LF (GAUZE/BANDAGES/DRESSINGS) IMPLANT
GLOVE BIO SURGEON STRL SZ7 (GLOVE) ×4 IMPLANT
GLOVE BIOGEL PI IND STRL 8 (GLOVE) ×5 IMPLANT
GLOVE BIOGEL PI INDICATOR 8 (GLOVE) ×5
GLOVE ECLIPSE 7.5 STRL STRAW (GLOVE) ×12 IMPLANT
GLOVE ECLIPSE 9.0 STRL (GLOVE) ×2 IMPLANT
GOWN STRL REUS W/ TWL LRG LVL3 (GOWN DISPOSABLE) ×1 IMPLANT
GOWN STRL REUS W/ TWL XL LVL3 (GOWN DISPOSABLE) ×2 IMPLANT
GOWN STRL REUS W/TWL 2XL LVL3 (GOWN DISPOSABLE) ×6 IMPLANT
GOWN STRL REUS W/TWL LRG LVL3 (GOWN DISPOSABLE) ×1
GOWN STRL REUS W/TWL XL LVL3 (GOWN DISPOSABLE) ×2
HEMOSTAT POWDER KIT SURGIFOAM (HEMOSTASIS) ×2 IMPLANT
KIT BASIN OR (CUSTOM PROCEDURE TRAY) ×2 IMPLANT
KIT INFUSE SMALL (Orthopedic Implant) ×2 IMPLANT
KIT ROOM TURNOVER OR (KITS) ×2 IMPLANT
LIQUID BAND (GAUZE/BANDAGES/DRESSINGS) ×4 IMPLANT
MILL MEDIUM DISP (BLADE) ×2 IMPLANT
NEEDLE BONE MARROW 8GAX6 (NEEDLE) IMPLANT
NEEDLE SPNL 18GX3.5 QUINCKE PK (NEEDLE) ×2 IMPLANT
NEEDLE SPNL 22GX3.5 QUINCKE BK (NEEDLE) ×4 IMPLANT
NS IRRIG 1000ML POUR BTL (IV SOLUTION) ×2 IMPLANT
PACK LAMINECTOMY NEURO (CUSTOM PROCEDURE TRAY) ×2 IMPLANT
PAD ARMBOARD 7.5X6 YLW CONV (MISCELLANEOUS) ×6 IMPLANT
PATTIES SURGICAL .5 X.5 (GAUZE/BANDAGES/DRESSINGS) IMPLANT
PATTIES SURGICAL .5 X1 (DISPOSABLE) IMPLANT
PATTIES SURGICAL 1X1 (DISPOSABLE) IMPLANT
PEEK PLIF AVS 10X25X4 (Peek) ×4 IMPLANT
ROD 5.5X30MM (Rod) ×2 IMPLANT
ROD RADIUS 35MM (Rod) ×2 IMPLANT
SCREW 5.75X50MM (Screw) ×8 IMPLANT
SPONGE LAP 4X18 X RAY DECT (DISPOSABLE) IMPLANT
SPONGE NEURO XRAY DETECT 1X3 (DISPOSABLE) IMPLANT
SPONGE SURGIFOAM ABS GEL 100 (HEMOSTASIS) ×2 IMPLANT
STRIP BIOACTIVE VITOSS 25X100X (Neuro Prosthesis/Implant) ×2 IMPLANT
STRIP BIOACTIVE VITOSS 25X52X4 (Orthopedic Implant) ×2 IMPLANT
SUT VIC AB 1 CT1 18XBRD ANBCTR (SUTURE) ×2 IMPLANT
SUT VIC AB 1 CT1 8-18 (SUTURE) ×2
SUT VIC AB 2-0 CP2 18 (SUTURE) ×6 IMPLANT
SYR 20ML ECCENTRIC (SYRINGE) ×2 IMPLANT
SYR 3ML LL SCALE MARK (SYRINGE) ×8 IMPLANT
SYR CONTROL 10ML LL (SYRINGE) ×2 IMPLANT
TAPE CLOTH SURG 4X10 WHT LF (GAUZE/BANDAGES/DRESSINGS) ×2 IMPLANT
TOWEL OR 17X24 6PK STRL BLUE (TOWEL DISPOSABLE) ×2 IMPLANT
TOWEL OR 17X26 10 PK STRL BLUE (TOWEL DISPOSABLE) ×2 IMPLANT
TRAY FOLEY CATH 14FRSI W/METER (CATHETERS) ×2 IMPLANT
WATER STERILE IRR 1000ML POUR (IV SOLUTION) ×2 IMPLANT

## 2014-06-29 NOTE — Progress Notes (Signed)
Filed Vitals:   06/29/14 1325 06/29/14 1340 06/29/14 1405 06/29/14 1617  BP: 110/40 113/50 138/62 145/68  Pulse: 74 79 71 71  Temp:   97.8 F (36.6 C) 97.6 F (36.4 C)  TempSrc:      Resp:   18 16  Weight:      SpO2: 100% 100% 96% 96%    Patient resting in bed, has already ambulated in the halls. Dressing clean and dry. Good relief of right lumbar radicular pain. Foley remains to straight drainage, patient has asked that we leave it in until the morning.  Plan: Progressing through postoperative recovery. Encouraged to ambulate again this evening and again in the morning. To be seen tomorrow by PT and OT. We'll place consultation to case management for referral to [facility] place for rehabilitation following surgery.  Hosie Spangle, MD 06/29/2014, 5:00 PM

## 2014-06-29 NOTE — Anesthesia Procedure Notes (Signed)
Procedure Name: Intubation Date/Time: 06/29/2014 7:42 AM Performed by: Suzy Bouchard Pre-anesthesia Checklist: Patient identified, Timeout performed, Emergency Drugs available, Suction available and Patient being monitored Patient Re-evaluated:Patient Re-evaluated prior to inductionOxygen Delivery Method: Circle system utilized Preoxygenation: Pre-oxygenation with 100% oxygen Intubation Type: IV induction Ventilation: Mask ventilation without difficulty Laryngoscope Size: Miller and 2 Grade View: Grade II Tube type: Oral Tube size: 7.0 mm Number of attempts: 1 Airway Equipment and Method: Stylet Placement Confirmation: breath sounds checked- equal and bilateral,  positive ETCO2 and ETT inserted through vocal cords under direct vision Secured at: 21 cm Tube secured with: Tape Dental Injury: Teeth and Oropharynx as per pre-operative assessment

## 2014-06-29 NOTE — Transfer of Care (Signed)
Immediate Anesthesia Transfer of Care Note  Patient: Pamela Moore  Procedure(s) Performed: Procedure(s) with comments: POSTERIOR LUMBAR FUSION 1 LEVEL (N/A) - L34 decompression with posterior lumbar interbody fusion interbody prosthesis posterior lateral arthrodesis and posterior nonsegmental instrumentation  Patient Location: PACU  Anesthesia Type:General  Level of Consciousness: sedated  Airway & Oxygen Therapy: Patient Spontanous Breathing and Patient connected to nasal cannula oxygen  Post-op Assessment: Report given to RN, Post -op Vital signs reviewed and stable and Patient moving all extremities  Post vital signs: Reviewed and stable  Last Vitals:  Filed Vitals:   06/29/14 0609  BP: 172/71  Pulse: 75  Temp: 36.4 C  Resp: 20    Complications: No apparent anesthesia complications

## 2014-06-29 NOTE — Op Note (Signed)
06/29/2014  11:17 AM  PATIENT:  Pamela Moore  81 y.o. female  PRE-OPERATIVE DIAGNOSIS:  L3-4 Lumbar stenosis with neurogenic claudication, grade 1-2 degenerative spondylolisthesis, lumbago, lumbar radiculopathy  POST-OPERATIVE DIAGNOSIS:  L3-4 Lumbar stenosis with neurogenic claudication, grade 1-2 degenerative spondylolisthesis, lumbago, lumbar radiculopathy  PROCEDURE:  Procedure(s):  Bilateral L3 and L4 lumbar laminectomy, bilateral L3-4 facetectomies and foraminotomies for decompression of the stenotic compression of the exiting L3 and L4 nerve roots bilaterally, with decompression beyond that required for interbody arthrodesis; bilateral L3-4 posterior lumbar interbody arthrodesis with AVS peek interbody implants, Vitoss BA with bone marrow aspirate, and infuse; bilateral L3-4 posterior lateral arthrodesis with nonsegmental radius posterior instrumentation, Vitoss BA with bone marrow aspirate, and infuse  SURGEON:  Surgeon(s): Hosie Spangle, MD Charlie Pitter, MD  ASSISTANTS: Earnie Larsson, M.D.  ANESTHESIA:   general  EBL:  Total I/O In: 1000 [I.V.:1000] Out: 440 [Urine:240; Blood:200]  BLOOD ADMINISTERED:none  CELL SAVER GIVEN: The Cell Saver technician felt that there was insufficient blood loss or process to collect the blood.  COUNT: Correct per nursing staff  DICTATION: Patient is brought to the operating room placed under general endotracheal anesthesia. The patient was turned to prone position the lumbar region was prepped with Betadine soap and solution and draped in a sterile fashion. The midline was infiltrated with local anesthesia with epinephrine. A localizing x-ray was taken and then a midline incision was made carried down through the subcutaneous tissue, bipolar cautery and electrocautery were used to maintain hemostasis. Dissection was carried down to the lumbar fascia. The fascia was incised bilaterally and the paraspinal muscles were dissected with a spinous  process and lamina in a subperiosteal fashion. Another x-ray was taken for localization and the L3-4 level was localized. Dissection was then carried out laterally over the facet complex and the transverse processes of L3 and L4 were exposed and decorticated. We then proceeded with the decompression. Bilateral L3-4 laminotomies were performed using the high-speed drill and Kerrison punches. Dissection was carried laterally, performing bilateral facetectomy, from marked hypertrophic facet arthropathy, using the high-speed drill and Kerrison punches. Foraminotomies were then performed to decompress the stenotic compression of the exiting L3 and L4 nerve roots bilaterally. Once the decompression stenotic compression of the thecal sac and exiting nerve roots was completed we proceeded with the posterior lumbar interbody arthrodesis. The annulus was incised bilaterally and the disc space entered. A thorough discectomy was performed using pituitary rongeurs and curettes. Once the discectomy was completed we began to prepare the endplate surfaces removing the cartilaginous endplates surface. We then measured the height of the intervertebral disc space. We selected 10 x 25 x 4 AVS peek interbody implants.  The C-arm fluoroscope was then draped and brought in the field and we identified the pedicle entry points bilaterally at the L3 and L4 levels. Each of the 4 pedicles was probed, we aspirated bone marrow aspirate from the vertebral bodies, this was injected over 10 cc and  5 cc strips of Vitoss BA. Then each of the pedicles was examined with the ball probe good bony surfaces were found and no bony cuts were found. Each of the pedicles was then tapped with a 5.25 mm tap, again examined with the ball probe good threading was found and no bony cuts were found. We then placed 5.75 by 50 millimeter screws bilaterally at each level.  We then packed the AVS peek interbody implants with Vitoss BA with bone marrow aspirate and  infuse, and then  placed the first implant and on the right side, carefully retracting the thecal sac and nerve root medially. We then went back to the left side and packed the midline with additional Vitoss BA with bone marrow aspirate and infuse, and then placed a second implant and on the left side again retracting the thecal sac and nerve root medially.  We then packed the lateral gutter over the transverse processes and intertransverse space with Vitoss BA with bone marrow aspirate and infuse. We then selected pre-lordosed rods.  Using a 35 mm rod on the right and a 30 mm rod on the left.  They were placed within the screw heads and secured with locking caps once all 4 locking caps were placed final tightening was performed against a counter torque.  The wound had been irrigated multiple times during the procedure with saline solution and bacitracin solution, good hemostasis was established with a combination of bipolar cautery and Gelfoam with thrombin. Once good hemostasis was confirmed we proceeded with closure paraspinal muscles deep fascia and Scarpa's fascia were closed with interrupted undyed 1 Vicryl sutures the subcutaneous and subcuticular closed with interrupted inverted 2-0 undyed Vicryl sutures the skin edges were approximated with Dermabond. The wound was dressed with sterile gauze and Hypafix.  Following surgery the patient was turned back to the supine position to be reversed and the anesthetic extubated and transferred to the recovery room for further care.   PLAN OF CARE: Admit to inpatient   PATIENT DISPOSITION:  PACU - hemodynamically stable.   Delay start of Pharmacological VTE agent (>24hrs) due to surgical blood loss or risk of bleeding:  yes

## 2014-06-29 NOTE — Anesthesia Preprocedure Evaluation (Addendum)
Anesthesia Evaluation  Patient identified by MRN, date of birth, ID band Patient awake    Reviewed: Allergy & Precautions, NPO status   Airway Mallampati: I  TM Distance: >3 FB Neck ROM: Full    Dental  (+) Teeth Intact, Dental Advisory Given   Pulmonary          Cardiovascular     Neuro/Psych    GI/Hepatic GERD-  Medicated and Controlled,  Endo/Other    Renal/GU      Musculoskeletal   Abdominal   Peds  Hematology   Anesthesia Other Findings   Reproductive/Obstetrics                            Anesthesia Physical Anesthesia Plan  ASA: III  Anesthesia Plan: General   Post-op Pain Management:    Induction: Intravenous  Airway Management Planned: Oral ETT  Additional Equipment:   Intra-op Plan:   Post-operative Plan: Extubation in OR  Informed Consent: I have reviewed the patients History and Physical, chart, labs and discussed the procedure including the risks, benefits and alternatives for the proposed anesthesia with the patient or authorized representative who has indicated his/her understanding and acceptance.     Plan Discussed with: CRNA and Surgeon  Anesthesia Plan Comments:         Anesthesia Quick Evaluation

## 2014-06-29 NOTE — Anesthesia Postprocedure Evaluation (Signed)
Anesthesia Post Note  Patient: Pamela Moore  Procedure(s) Performed: Procedure(s) (LRB): POSTERIOR LUMBAR FUSION 1 LEVEL (N/A)  Anesthesia type: general  Patient location: PACU  Post pain: Pain level controlled  Post assessment: Patient's Cardiovascular Status Stable  Last Vitals:  Filed Vitals:   06/29/14 1123  BP: 129/70  Pulse: 127  Temp: 36.4 C  Resp: 17    Post vital signs: Reviewed and stable  Level of consciousness: sedated  Complications: No apparent anesthesia complications

## 2014-06-29 NOTE — H&P (Signed)
Subjective: Patient is a 81 y.o. female who is admitted for treatment of L3-4 grade 1-2 degenerative spondylolisthesis with associated marked to severe multi-factorial lumbar stenosis.  Patient has low back and right lumbar radicular pain.  She is admitted for an L3-4 lumbar decompression including lamincectomy, facetectomy, and foraminotomy, and a bilateral lumbar stabilization with posterior lumbar interbody arthrodesis and posterolateral arthrodesis.    Past Medical History  Diagnosis Date  . High cholesterol   . Hypothyroidism   . Arthritis   . Anxiety   . GERD (gastroesophageal reflux disease)   . Hx of transfusion of packed red blood cells     Past Surgical History  Procedure Laterality Date  . Knee surgery Left   . Vaginal hysterectomy  1964  . Rectal surgery    . Eye surgery Bilateral     cataract  . Bladder suspension      x 3  . Rectocele repair      Prescriptions prior to admission  Medication Sig Dispense Refill Last Dose  . acetaminophen (TYLENOL) 500 MG tablet Take 1,000 mg by mouth every 6 (six) hours as needed (pain).   06/29/2014 at 0400  . Ascorbic Acid (VITAMIN C) 1000 MG tablet Take 1,000 mg by mouth daily.   06/28/2014 at Unknown time  . aspirin EC 81 MG tablet Take 81 mg by mouth daily.   Past Month at Unknown time  . Calcium Carb-Cholecalciferol (CALCIUM 600 + D PO) Take 600 mg by mouth 2 (two) times daily.   06/28/2014 at Unknown time  . Cholecalciferol (VITAMIN D3) 5000 UNITS TABS Take 5,000 Units by mouth daily.   06/28/2014 at Unknown time  . Coenzyme Q10 (COQ10) 200 MG CAPS Take 200 mg by mouth daily.   06/28/2014 at Unknown time  . donepezil (ARICEPT) 10 MG tablet Take 10 mg by mouth at bedtime.   06/28/2014 at Unknown time  . fluticasone (FLONASE) 50 MCG/ACT nasal spray Place 2 sprays into both nostrils daily.   4 06/28/2014 at Unknown time  . furosemide (LASIX) 20 MG tablet Take 20 mg by mouth daily.   Past Week at Unknown time  . levothyroxine (SYNTHROID,  LEVOTHROID) 50 MCG tablet Take 50 mcg by mouth daily before breakfast.   06/29/2014 at 0400  . Magnesium 250 MG TABS Take 250 mg by mouth daily.   06/28/2014 at Unknown time  . magnesium hydroxide (MILK OF MAGNESIA) 400 MG/5ML suspension Take 30 mLs by mouth daily as needed for mild constipation.   Past Week at Unknown time  . meloxicam (MOBIC) 15 MG tablet Take 15 mg by mouth daily.   06/28/2014 at Unknown time  . Multiple Vitamin (MULTIVITAMIN WITH MINERALS) TABS tablet Take 1 tablet by mouth daily.   06/28/2014 at Unknown time  . Omega-3 Fatty Acids (FISH OIL) 1200 MG CAPS Take 1,200 mg by mouth daily.   06/28/2014 at Unknown time  . omeprazole (PRILOSEC) 20 MG capsule Take 40 mg by mouth daily before breakfast.    06/29/2014 at 0400  . Polyethyl Glycol-Propyl Glycol (SYSTANE OP) Place 1 drop into both eyes daily as needed (dry eyes).   06/28/2014 at Unknown time  . senna (SENOKOT) 8.6 MG TABS tablet Take 1 tablet by mouth daily as needed for mild constipation.   Past Week at Unknown time  . sertraline (ZOLOFT) 50 MG tablet Take 50 mg by mouth at bedtime.   06/28/2014 at Unknown time  . simvastatin (ZOCOR) 40 MG tablet Take 40 mg by mouth  at bedtime.    06/28/2014 at Unknown time  . temazepam (RESTORIL) 15 MG capsule Take 15 mg by mouth at bedtime.  3 06/28/2014 at Unknown time  . triamterene-hydrochlorothiazide (MAXZIDE-25) 37.5-25 MG per tablet Take 1 tablet by mouth daily.   06/28/2014 at Unknown time  . vitamin B-12 (CYANOCOBALAMIN) 1000 MCG tablet Take 1,000 mcg by mouth daily.   06/28/2014 at Unknown time  . Efinaconazole 10 % SOLN Apply 1 drop topically daily. (Patient not taking: Reported on 06/22/2014) 4 mL 9 Not Taking at Unknown time   Allergies  Allergen Reactions  . Raloxifene Rash    Evista    History  Substance Use Topics  . Smoking status: Never Smoker   . Smokeless tobacco: Never Used  . Alcohol Use: 0.6 oz/week    1 Glasses of wine per week     Comment: RED WINE OCCASIONALLY    History  reviewed. No pertinent family history.   Review of Systems A comprehensive review of systems was negative.  Objective: Vital signs in last 24 hours: Temp:  [97.6 F (36.4 C)] 97.6 F (36.4 C) (02/08 0609) Pulse Rate:  [75] 75 (02/08 0609) Resp:  [20] 20 (02/08 0609) BP: (172)/(71) 172/71 mmHg (02/08 0609) SpO2:  [100 %] 100 % (02/08 0609) Weight:  [64.864 kg (143 lb)] 64.864 kg (143 lb) (02/08 0609)  EXAM: Patient is a well developed, well nourished female in no acute distress.   Lungs have distant breath sounds, but are clear to auscultation , the patient has symmetrical respiratory excursion. Heart has distant heart sounds, but has a regular rate and rhythm normal S1 and S2 no murmur.   Abdomen is soft nontender nondistended bowel sounds are present. Extremity examination shows no clubbing cyanosis or edema. Motor examination shows 5 over 5 strength in the lower extremities including the iliopsoas quadriceps dorsiflexor extensor hallicus  longus and plantar flexor bilaterally. Sensation is decreased to pinprick in the lateral right foot, but is otherwise intact to pinprick in the distal lower extremities. Reflexes are symmetrical bilaterally. No pathologic reflexes are present. Patient has a normal gait and stance.  Data Review:CBC    Component Value Date/Time   WBC 6.4 06/22/2014 1510   RBC 4.60 06/22/2014 1510   HGB 14.0 06/22/2014 1510   HCT 40.9 06/22/2014 1510   PLT 216 06/22/2014 1510   MCV 88.9 06/22/2014 1510   MCH 30.4 06/22/2014 1510   MCHC 34.2 06/22/2014 1510   RDW 12.7 06/22/2014 1510                          BMET    Component Value Date/Time   NA 135 06/22/2014 1510   K 3.6 06/22/2014 1510   CL 98 06/22/2014 1510   CO2 29 06/22/2014 1510   GLUCOSE 96 06/22/2014 1510   BUN 13 06/22/2014 1510   CREATININE 0.91 06/22/2014 1510   CALCIUM 9.6 06/22/2014 1510   GFRNONAA 56* 06/22/2014 1510   GFRAA 65* 06/22/2014 1510     Assessment/Plan: Patient with low  back and right lumbar pain, with L3-4 lumbar stenosis and spondylolisthesis, who is admitted for lumbar decompression and stabilization.  I've discussed with the patient the nature of his condition, the nature the surgical procedure, the typical length of surgery, hospital stay, and overall recuperation, the limitations postoperatively, and risks of surgery. I discussed risks including risks of infection, bleeding, possibly need for transfusion, the risk of nerve root dysfunction with pain,  weakness, numbness, or paresthesias, the risk of dural tear and CSF leakage and possible need for further surgery, the risk of failure of the arthrodesis and possibly for further surgery, the risk of anesthetic complications including myocardial infarction, stroke, pneumonia, and death. We discussed the need for postoperative immobilization in a lumbar brace. Understanding all this the patient does wish to proceed with surgery and is admitted for such.    Hosie Spangle, MD 06/29/2014 6:45 AM

## 2014-06-30 MED ORDER — OMEPRAZOLE 20 MG PO CPDR
40.0000 mg | DELAYED_RELEASE_CAPSULE | Freq: Every day | ORAL | Status: DC
  Administered 2014-06-30 – 2014-07-01 (×2): 40 mg via ORAL
  Filled 2014-06-30 (×3): qty 2

## 2014-06-30 MED FILL — Sodium Chloride IV Soln 0.9%: INTRAVENOUS | Qty: 1000 | Status: AC

## 2014-06-30 MED FILL — Heparin Sodium (Porcine) Inj 1000 Unit/ML: INTRAMUSCULAR | Qty: 30 | Status: AC

## 2014-06-30 NOTE — Evaluation (Signed)
Occupational Therapy Evaluation Patient Details Name: Pamela Moore MRN: [id number] DOB: 09/03/1928 Today's Date: 06/30/2014    History of Present Illness 81 y.o. s/p POSTERIOR LUMBAR FUSION 1 LEVEL.   Clinical Impression   Pt s/p above. Pt lives alone. Recommending SNF for rehab. Will defer all further OT needs to SNF.    Follow Up Recommendations  SNF    Equipment Recommendations  Other (comment) (defer to next venue)    Recommendations for Other Services       Precautions / Restrictions Precautions Precautions: Back;Fall Precaution Booklet Issued:  (given by PT) Precaution Comments: educated on precautions Required Braces or Orthoses: Spinal Brace Spinal Brace: Lumbar corset;Other (comment) (doffed while sitting; already on pt when OT arrived) Spinal Brace Comments: reviewed precautions Restrictions Weight Bearing Restrictions: No      Mobility Bed Mobility Overal bed mobility: Needs Assistance Bed Mobility: Rolling;Sit to Sidelying Rolling: Supervision       Sit to sidelying: Min guard General bed mobility comments: cues for technique.  Transfers Overall transfer level: Needs assistance Equipment used: Rolling walker (2 wheeled) Transfers: Sit to/from Stand Sit to Stand: Min guard         General transfer comment:  (cues for hand placement.  )         ADL Overall ADL's : Needs assistance/impaired   Grooming: Set up;Supervision/safety;Sitting   Upper Body Bathing: Set up;Supervision/ safety;Sitting   Lower Body Bathing: Minimal assistance;Sit to/from stand   Upper Body Dressing : Set up;Supervision/safety;Sitting   Lower Body Dressing: Minimal assistance;Sit to/from stand   Toilet Transfer: Min guard;Ambulation (cane; chair/bed)   Toileting- Clothing Manipulation and Hygiene: Set up;Supervision/safety;Sit to/from stand       Functional mobility during ADLs: Min guard;Cane General ADL Comments: Discussed incorporating precautions into  functional activities. Educated on back brace. Educated on use of cup for oral care and placement of grooming items to avoid breaking precautions. Educated on AE and dressing technique. Pt able to cross legs over knees for LB dressing, but cues given for precautions.  Educated on what pt could use for toilet aide (pt simulated hygiene, but unsure if she could wipe thoroughly without twisting). Discussed positioning of pillows.      Vision  Pt wears glasses.                   Perception     Praxis      Pertinent Vitals/Pain Pain Assessment: 0-10 Pain Score: 5  Pain Location: back Pain Intervention(s): Monitored during session;Repositioned     Hand Dominance Right   Extremity/Trunk Assessment Upper Extremity Assessment Upper Extremity Assessment: Overall WFL for tasks assessed   Lower Extremity Assessment Lower Extremity Assessment: Defer to PT evaluation       Communication Communication Communication: No difficulties   Cognition Arousal/Alertness: Awake/alert Behavior During Therapy: WFL for tasks assessed/performed Overall Cognitive Status: Within Functional Limits for tasks assessed                     General Comments       Exercises       Shoulder Instructions      Home Living Family/patient expects to be discharged to:: Skilled nursing facility Living Arrangements: Alone                               Additional Comments: pt has straight cane;RW with 4 wheels, quad cane, 3N1,  tub/shower with tub  bench and 3 steps with bil rails to get into home.  Son can assist prn after SNF stay.      Prior Functioning/Environment Level of Independence: Independent with assistive device(s)             OT Diagnosis: Acute pain   OT Problem List:     OT Treatment/Interventions:      OT Goals(Current goals can be found in the care plan section)   OT Frequency:     Barriers to D/C:            Co-evaluation               End of Session Equipment Utilized During Treatment: Back brace;Other (comment) (cane)  Activity Tolerance: Patient tolerated treatment well Patient left: in bed;with call bell/phone within reach   Time: 1003-1018 OT Time Calculation (min): 15 min Charges:  OT General Charges $OT Visit: 1 Procedure OT Evaluation $Initial OT Evaluation Tier I: 1 Procedure G-CodesBenito Mccreedy OTR/L [phone] 06/30/2014, 12:50 PM

## 2014-06-30 NOTE — Clinical Social Work Placement (Signed)
Clinical Social Work Department CLINICAL SOCIAL WORK PLACEMENT NOTE 06/30/2014  Patient:  Pamela Moore, Pamela Moore  Account Number:  [id number] Admit date:  06/29/2014  Clinical Social Worker:  Barbette Or, LCSW  Date/time:  06/30/2014 03:00 PM  Clinical Social Work is seeking post-discharge placement for this patient at the following level of care:   [place]   (*CSW will update this form in Epic as items are completed)   06/30/2014  Patient/family provided with [place] Department of Clinical Social Work's list of facilities offering this level of care within the geographic area requested by the patient (or if unable, by the patient's family).  06/30/2014  Patient/family informed of their freedom to choose among providers that offer the needed level of care, that participate in Medicare, Medicaid or managed care program needed by the patient, have an available bed and are willing to accept the patient.  06/30/2014  Patient/family informed of [facility]' ownership interest in [facility], as well as of the fact that they are under no obligation to receive care at this facility.  PASARR submitted to EDS on 06/30/2014 PASARR number received on 06/30/2014  FL2 transmitted to all facilities in geographic area requested by pt/family on  06/30/2014 FL2 transmitted to all facilities within larger geographic area on   Patient informed that his/her managed care company has contracts with or will negotiate with  certain facilities, including the following:     Patient/family informed of bed offers received:  06/30/2014 Patient chooses bed at [facility] Physician recommends and patient chooses bed at    Patient to be transferred to [facility] on  07/01/2014 Patient to be transferred to facility by [facility] Patient and family notified of transfer on 07/01/2014 Name of family member notified:    The following physician request were entered in Epic:   Additional  Comments:

## 2014-06-30 NOTE — Clinical Social Work Psychosocial (Signed)
Clinical Social Work Department BRIEF PSYCHOSOCIAL ASSESSMENT 06/30/2014  Patient:  Pamela Moore, Pamela Moore     Account Number:  [id number]     Admit date:  06/29/2014  Clinical Social Worker:  Kingsley Spittle, CLINICAL SOCIAL WORKER  Date/Time:  06/30/2014 12:00 N  Referred by:  Physician  Date Referred:  06/30/2014 Referred for  ALF Placement   Other Referral:   N/A   Interview type:  Patient Other interview type:   N/A    PSYCHOSOCIAL DATA Living Status:  ALONE Admitted from facility:   Level of care:   Primary support name:  Sol Passer Primary support relationship to patient:  FRIEND Degree of support available:   Support is strong.    CURRENT CONCERNS Current Concerns  Post-Acute Placement   Other Concerns:   N/A    SOCIAL WORK ASSESSMENT / PLAN BSW intern spoke with patient at bedside [place] her plans for discharge. Patient would like to go to [facility] and has already done paperwork with them via phone call. Patient was agreeable to the discharge pland and seemed content with her decision to go to [facility].   Assessment/plan status:  Psychosocial Support/Ongoing Assessment of Needs Other assessment/ plan:   N/A   Information/referral to community resources:   N/A    PATIENT'S/FAMILY'S RESPONSE TO PLAN OF CARE: Patient was agreeable to be discharged to [facility] and spoke highly of the facility while in the room. Patient noted to be alert and oriented; very pleasant during visit. Stated that she has mae her own arrangements for short term SNF. CSW will assist with d/c to SNF when medically stable.       Kingsley Spittle, [place] Intern, [phone]

## 2014-06-30 NOTE — Evaluation (Signed)
Physical Therapy Evaluation Patient Details Name: Pamela Moore MRN: [id number] DOB: 1928/09/17 Today's Date: 06/30/2014   History of Present Illness  81 y.o. s/p POSTERIOR LUMBAR FUSION 1 LEVEL.  Clinical Impression  Pt admitted with above diagnosis. Pt currently with functional limitations due to the deficits listed below (see PT Problem List). Pt needs continued therapy at SNF to progress to independence.   Pt will benefit from skilled PT to increase their independence and safety with mobility to allow discharge to the venue listed below.      Follow Up Recommendations SNF;Supervision/Assistance - 24 hour    Equipment Recommendations  None recommended by PT    Recommendations for Other Services       Precautions / Restrictions Precautions Precautions: Back;Fall Precaution Booklet Issued: Yes (comment) Precaution Comments: educated on precautions Required Braces or Orthoses: Spinal Brace Spinal Brace: Lumbar corset;Applied in sitting position Spinal Brace Comments: reviewed precautions Restrictions Weight Bearing Restrictions: No      Mobility  Bed Mobility Overal bed mobility: Needs Assistance Bed Mobility: Rolling;Sit to Sidelying Rolling: Supervision       Sit to sidelying: Min guard General bed mobility comments: cues for technique.Practiced multiple times.  Pt needs reinforcement. Pt donned brace independently.    Transfers Overall transfer level: Needs assistance Equipment used: Rolling walker (2 wheeled) Transfers: Sit to/from Stand Sit to Stand: Min guard         General transfer comment:  (cues for hand placement.  )  Ambulation/Gait Ambulation/Gait assistance: Min guard Ambulation Distance (Feet): 400 Feet Assistive device: Straight cane Gait Pattern/deviations: Step-to pattern;Decreased stride length;Antalgic;Drifts right/left;Narrow base of support   Gait velocity interpretation: Below normal speed for age/gender General Gait Details: Pt  overall safe with cane.  Ambulates slowly.  No LOB however no challenges given today.    Stairs            Wheelchair Mobility    Modified Rankin (Stroke Patients Only)       Balance Overall balance assessment: Needs assistance         Standing balance support: During functional activity;Single extremity supported Standing balance-Leahy Scale: Fair Standing balance comment: can stand statically without device.                              Pertinent Vitals/Pain Pain Assessment: 0-10 Pain Score: 5  Pain Location: back Pain Descriptors / Indicators: Aching Pain Intervention(s): Limited activity within patient's tolerance;Monitored during session;Repositioned;RN gave pain meds during session  VSS    Home Living Family/patient expects to be discharged to:: Skilled nursing facility Living Arrangements: Alone               Additional Comments: pt has straight cane;RW with 4 wheels, quad cane, 3N1,  tub/shower with tub bench and 3 steps with bil rails to get into home.  Son can assist prn after SNF stay.    Prior Function Level of Independence: Independent with assistive device(s)               Hand Dominance   Dominant Hand: Right    Extremity/Trunk Assessment   Upper Extremity Assessment: Defer to OT evaluation           Lower Extremity Assessment: Generalized weakness         Communication   Communication: No difficulties  Cognition Arousal/Alertness: Awake/alert Behavior During Therapy: WFL for tasks assessed/performed Overall Cognitive Status: Within Functional Limits for tasks assessed  General Comments General comments (skin integrity, edema, etc.): Educated on back precautions and all aspects of handout.      Exercises        Assessment/Plan    PT Assessment Patient needs continued PT services  PT Diagnosis Generalized weakness;Acute pain   PT Problem List Decreased balance;Decreased  mobility;Decreased knowledge of use of DME;Decreased safety awareness;Decreased knowledge of precautions;Decreased activity tolerance;Pain  PT Treatment Interventions DME instruction;Gait training;Stair training;Functional mobility training;Therapeutic activities;Therapeutic exercise;Balance training;Patient/family education   PT Goals (Current goals can be found in the Care Plan section) Acute Rehab PT Goals Patient Stated Goal: to go home PT Goal Formulation: With patient Time For Goal Achievement: 07/07/14 Potential to Achieve Goals: Good    Frequency Min 5X/week   Barriers to discharge Decreased caregiver support      Co-evaluation               End of Session Equipment Utilized During Treatment: Gait belt;Back brace Activity Tolerance: Patient tolerated treatment well Patient left: in chair;with call bell/phone within reach;Other (comment) (OT came in at end of eval) Nurse Communication: Mobility status         Time: 0932-1002 PT Time Calculation (min) (ACUTE ONLY): 30 min   Charges:   PT Evaluation $Initial PT Evaluation Tier I: 1 Procedure PT Treatments $Gait Training: 8-22 mins   PT G Codes:        Vedha Tercero F 07/04/2014, 12:12 PM  [place] [facility],PT Acute Rehabilitation [phone] (pager)

## 2014-06-30 NOTE — Progress Notes (Signed)
Filed Vitals:   06/29/14 2020 06/29/14 2354 06/30/14 0437 06/30/14 0739  BP: 115/55 117/56 140/53 131/51  Pulse: 82 82 97 80  Temp: 98.8 F (37.1 C) 98.9 F (37.2 C) 99.2 F (37.3 C) 98.6 F (37 C)  TempSrc: Oral Oral Oral   Resp: 16 16 16 16   Weight:      SpO2: 98% 95% 98% 97%    Patient sitting up in bed, eating breakfast. Has already ambulated this morning, and ambulated several times last night. Dressing clean and dry. Foley DC'd this morning, no void yet, nursing staff to monitor voiding function. To be seen by PT and OT today, in anticipation of transfer to [facility] placed for rehabilitation.  Plan: Continue to progress through postoperative recovery. To be seen by case management today re: rehabilitation.  Hosie Spangle, MD 06/30/2014, 8:17 AM

## 2014-07-01 MED ORDER — HYDROCODONE-ACETAMINOPHEN 5-325 MG PO TABS: 1.0000 | ORAL_TABLET | ORAL | Status: DC | PRN

## 2014-07-01 NOTE — Discharge Summary (Signed)
Physician Discharge Summary  Patient ID: Pamela Moore MRN: [id number] DOB/AGE: 79/12/1928 79 y.o.  Admit date: 06/29/2014 Discharge date: 07/01/2014  Admission Diagnoses:  L3-4 Lumbar stenosis with neurogenic claudication, grade 1-2 degenerative spondylolisthesis, lumbago, lumbar radiculopathy  Discharge Diagnoses:  L3-4 Lumbar stenosis with neurogenic claudication, grade 1-2 degenerative spondylolisthesis, lumbago, lumbar radiculopathy Active Problems:   Lumbar stenosis with neurogenic claudication   Discharged Condition: good  Hospital Course: Patient was admitted, underwent a bilateral L3-4 lumbar decompression and fusion. She is done well following surgery and is up and ambulating actively in the halls. She has been seen by PT and OT, and arrangements have been made for her to be transferred to [facility] for further rehabilitation. Her wound is healing nicely. She's been given instructions regarding wound care and activities. She is to shower daily, her wound is not to be covered her to shower, but instead the shower water to run over the incision area each day. She is to avoid bending, lifting, and twisting. She is to wear her lumbar brace when out of bed, except if she is getting up in the middle the night to go to the bathroom, in which case she can forego the use of the brace. She is to ambulate increasing distances in the hallways at least 5-6 times per day. She is to return for follow-up with me in about 3 weeks.  Consults: Physical therapy, occupational therapy  Discharge Exam: Blood pressure 153/55, pulse 93, temperature 99.3 F (37.4 C), temperature source Oral, resp. rate 16, weight 64.864 kg (143 lb), SpO2 98 %.  Disposition: Skilled nursing facility for rehabilitation     Medication List    STOP taking these medications        Efinaconazole 10 % Soln     meloxicam 15 MG tablet  Commonly known as:  MOBIC      TAKE these medications        acetaminophen 500 MG  tablet  Commonly known as:  TYLENOL  Take 1,000 mg by mouth every 6 (six) hours as needed (pain).     aspirin EC 81 MG tablet  Take 81 mg by mouth daily.     CALCIUM 600 + D PO  Take 600 mg by mouth 2 (two) times daily.     CoQ10 200 MG Caps  Take 200 mg by mouth daily.     donepezil 10 MG tablet  Commonly known as:  ARICEPT  Take 10 mg by mouth at bedtime.     Fish Oil 1200 MG Caps  Take 1,200 mg by mouth daily.     fluticasone 50 MCG/ACT nasal spray  Commonly known as:  FLONASE  Place 2 sprays into both nostrils daily.     furosemide 20 MG tablet  Commonly known as:  LASIX  Take 20 mg by mouth daily.     HYDROcodone-acetaminophen 5-325 MG per tablet  Commonly known as:  NORCO/VICODIN  Take 1-2 tablets by mouth every 4 (four) hours as needed (pain).     levothyroxine 50 MCG tablet  Commonly known as:  SYNTHROID, LEVOTHROID  Take 50 mcg by mouth daily before breakfast.     Magnesium 250 MG Tabs  Take 250 mg by mouth daily.     magnesium hydroxide 400 MG/5ML suspension  Commonly known as:  MILK OF MAGNESIA  Take 30 mLs by mouth daily as needed for mild constipation.     multivitamin with minerals Tabs tablet  Take 1 tablet by mouth daily.  omeprazole 20 MG capsule  Commonly known as:  PRILOSEC  Take 40 mg by mouth daily before breakfast.     senna 8.6 MG Tabs tablet  Commonly known as:  SENOKOT  Take 1 tablet by mouth daily as needed for mild constipation.     sertraline 50 MG tablet  Commonly known as:  ZOLOFT  Take 50 mg by mouth at bedtime.     simvastatin 40 MG tablet  Commonly known as:  ZOCOR  Take 40 mg by mouth at bedtime.     SYSTANE OP  Place 1 drop into both eyes daily as needed (dry eyes).     temazepam 15 MG capsule  Commonly known as:  RESTORIL  Take 15 mg by mouth at bedtime.     triamterene-hydrochlorothiazide 37.5-25 MG per tablet  Commonly known as:  MAXZIDE-25  Take 1 tablet by mouth daily.     vitamin B-12 1000 MCG tablet   Commonly known as:  CYANOCOBALAMIN  Take 1,000 mcg by mouth daily.     vitamin C 1000 MG tablet  Take 1,000 mg by mouth daily.     Vitamin D3 5000 UNITS Tabs  Take 5,000 Units by mouth daily.         Signed: Hosie Spangle, MD 07/01/2014, 8:22 AM

## 2014-07-01 NOTE — Progress Notes (Signed)
Pt given D/C instructions with verbal understanding. Pt's incision is clean and dry with no sign of infection. Pt's IV was removed prior to D/C. Social worker called to set up Pt at [facility]. Pt information packet was sent with Pt to facility. Pt D/C'd to SNF via wheelchair with friend @ 1225 per MD order. Pt is stable @ D/C and has no other needs at this time. Holli Humbles, RN

## 2014-07-01 NOTE — Discharge Instructions (Signed)

## 2014-07-01 NOTE — Progress Notes (Signed)
Physical Therapy Treatment Patient Details Name: Pamela Moore MRN: [id number] DOB: 01/28/1929 Today's Date: 07/01/2014    History of Present Illness 81 y.o. s/p POSTERIOR LUMBAR FUSION 1 LEVEL.    PT Comments    Pt admitted with above diagnosis. Pt currently with functional limitations due to balance and endurance  deficits as well as issues with safety awareness. Pt needs short term NHP for therapy as she needs a lot of repetition to remember and follow back precautions.  Pt having difficulty following log roll specifically and doesn't recall all back precautions.  Pt also with LOB with challenges.   Pt will benefit from skilled PT to increase their independence and safety with mobility to allow discharge to the venue listed below.    Follow Up Recommendations  SNF;Supervision/Assistance - 24 hour     Equipment Recommendations  None recommended by PT    Recommendations for Other Services       Precautions / Restrictions Precautions Precautions: Back;Fall Precaution Booklet Issued: Yes (comment) Precaution Comments: educated on precautions Required Braces or Orthoses: Spinal Brace Spinal Brace: Applied in sitting position;Lumbar corset Spinal Brace Comments: reviewed precautions Restrictions Weight Bearing Restrictions: No    Mobility  Bed Mobility Overal bed mobility: Needs Assistance Bed Mobility: Rolling;Sit to Sidelying Rolling: Supervision       Sit to sidelying: Min guard General bed mobility comments: Pt continues to have difficulty remembering technique for log roll.  Practiced multiple times and pt still needed cues.   Transfers Overall transfer level: Needs assistance Equipment used: Straight cane Transfers: Sit to/from Stand Sit to Stand: Min guard         General transfer comment: Cues for hand placement.  Pt took two attempts to get up from lower chair having to widen BOS to get up.    Ambulation/Gait Ambulation/Gait assistance: Min guard;Min  assist Ambulation Distance (Feet): 150 Feet Assistive device: Straight cane Gait Pattern/deviations: Step-to pattern;Decreased stride length;Antalgic;Drifts right/left;Narrow base of support   Gait velocity interpretation: Below normal speed for age/gender General Gait Details: Pt given challenges and needing min assist as she does not self correct and needs steadying assist.  Uses good safety with cane overall.  Pt had ambulated 90 feet and stated she was going to pass out.  This PT got nursing attention who brought pt a chair and pt began throwing up.  Took BP with it registering at 150/75, 98% O2 on RA and HR 114 bpm.  Pt has not had BM therefore she feels that is what is wrong.  Pt ambulated back and sat on toilet to try and have a BM.  Nursing aware.     Stairs            Wheelchair Mobility    Modified Rankin (Stroke Patients Only)       Balance Overall balance assessment: Needs assistance         Standing balance support: Single extremity supported;During functional activity Standing balance-Leahy Scale: Fair Standing balance comment: can stand statically without device however if challenges given, pt needs min assist to steady.               High level balance activites: Direction changes;Turns;Sudden stops;Head turns High Level Balance Comments: Pt cannot tolerate challenges to balance without min assist for steadying pt with straight cane.      Cognition Arousal/Alertness: Awake/alert Behavior During Therapy: WFL for tasks assessed/performed Overall Cognitive Status: Within Functional Limits for tasks assessed  Exercises      General Comments General comments (skin integrity, edema, etc.): Pt can only verbalize 2/3 precautions.  Pt still needs incr cues to follow back precautions and log roll.        Pertinent Vitals/Pain Pain Assessment: 0-10 Pain Score: 5  Pain Location: back Pain Descriptors / Indicators: Aching Pain  Intervention(s): Limited activity within patient's tolerance;Monitored during session;Premedicated before session;Repositioned  150/75, 98% on RA, 114 bpm.  Nursing aware.     Home Living                      Prior Function            PT Goals (current goals can now be found in the care plan section) Progress towards PT goals: Progressing toward goals    Frequency  Min 5X/week    PT Plan Current plan remains appropriate    Co-evaluation             End of Session Equipment Utilized During Treatment: Gait belt;Back brace Activity Tolerance: Patient tolerated treatment well Patient left: Other (comment);with call bell/phone within reach (left on toilet with nursing aware.)     Time: [phone] PT Time Calculation (min) (ACUTE ONLY): 17 min  Charges:  $Gait Training: 8-22 mins                    G CodesDenice Paradise July 12, 2014, 9:53 AM [employer] Acute Rehabilitation [phone] (pager)

## 2014-07-01 NOTE — Clinical Social Work Note (Signed)
Clinical Social Work Department CLINICAL SOCIAL WORK PLACEMENT NOTE 06/30/2014  Patient: Pamela Moore, Pamela Moore Account Number: [id number] Admit date: 06/29/2014  Clinical Social Worker: Barbette Or, LCSW Date/time: 06/30/2014 03:00 PM  Clinical Social Work is seeking post-discharge placement for this patient at the following level of care: [place] (*CSW will update this form in Epic as items are completed)   06/30/2014 Patient/family provided with [place] Department of Clinical Social Work's list of facilities offering this level of care within the geographic area requested by the patient (or if unable, by the patient's family).  06/30/2014 Patient/family informed of their freedom to choose among providers that offer the needed level of care, that participate in Medicare, Medicaid or managed care program needed by the patient, have an available bed and are willing to accept the patient.  06/30/2014 Patient/family informed of [facility]' ownership interest in [facility], as well as of the fact that they are under no obligation to receive care at this facility.  PASARR submitted to EDS on 06/30/2014 PASARR number received on 06/30/2014  FL2 transmitted to all facilities in geographic area requested by pt/family on 06/30/2014 FL2 transmitted to all facilities within larger geographic area on   Patient informed that his/her managed care company has contracts with or will negotiate with certain facilities, including the following:    Patient/family informed of bed offers received: 06/30/2014 Patient chooses bed at [facility] Physician recommends and patient chooses bed at   Patient to be transferred to [facility] on 07/01/2014 Patient to be transferred to facility by Family's personal car Patient and family notified of transfer on 07/01/2014 Name of family member notified: Patient's friend HPOA at bedisde Kennyth Lose)  The following physician  request were entered in Epic:   Additional Comments:  Per MD patient ready for DC to [facility] : [facility]. RN, patient, patient's family, and facility notified of DC. RN given number for report (on DC packet with patient). DC packet on chart. CSW signing off.   Liz Beach MSW, [place], [place], [phone]

## 2014-07-02 ENCOUNTER — Non-Acute Institutional Stay (SKILLED_NURSING_FACILITY): Payer: PPO | Admitting: Registered Nurse

## 2014-07-02 ENCOUNTER — Encounter: Payer: Self-pay | Admitting: Registered Nurse

## 2014-07-02 DIAGNOSIS — I10 Essential (primary) hypertension: Secondary | ICD-10-CM | POA: Diagnosis not present

## 2014-07-02 DIAGNOSIS — M4806 Spinal stenosis, lumbar region: Secondary | ICD-10-CM

## 2014-07-02 DIAGNOSIS — M48062 Spinal stenosis, lumbar region with neurogenic claudication: Secondary | ICD-10-CM

## 2014-07-02 DIAGNOSIS — F32A Depression, unspecified: Secondary | ICD-10-CM

## 2014-07-02 DIAGNOSIS — K219 Gastro-esophageal reflux disease without esophagitis: Secondary | ICD-10-CM

## 2014-07-02 DIAGNOSIS — E785 Hyperlipidemia, unspecified: Secondary | ICD-10-CM | POA: Diagnosis not present

## 2014-07-02 DIAGNOSIS — E039 Hypothyroidism, unspecified: Secondary | ICD-10-CM | POA: Diagnosis not present

## 2014-07-02 DIAGNOSIS — F329 Major depressive disorder, single episode, unspecified: Secondary | ICD-10-CM | POA: Diagnosis not present

## 2014-07-02 DIAGNOSIS — G4700 Insomnia, unspecified: Secondary | ICD-10-CM | POA: Diagnosis not present

## 2014-07-02 DIAGNOSIS — K5900 Constipation, unspecified: Secondary | ICD-10-CM | POA: Diagnosis not present

## 2014-07-02 DIAGNOSIS — R110 Nausea: Secondary | ICD-10-CM | POA: Diagnosis not present

## 2014-07-02 NOTE — Progress Notes (Signed)
Patient ID: Pamela Moore, female   DOB: [birth year], 81 y.o.   MRN: [id number]   Place of Service: [facility]  Allergies  Allergen Reactions  . Raloxifene Rash    Evista    Code Status: Full Code  Goals of Care: Longevity/STR  Chief Complaint  Patient presents with  . Hospitalization Follow-up    HPI 81 y.o. female with PMH of HTN, hypothyroidism, GERD, OA, HLD among others is being seen for a follow-up visit post hospital admission from 06/29/14 to 07/01/14 with L3-L4 lumbar stenosis with neurogenic claudication s/p bilateral lumbar decompression and fusion. She is here for short term rehab and her goal is to return home. Seen in room today. Pain is well managed with current regimen. Reported having trouble with constipation. Last BM was this morning but very small. Denies any other concerns.  Review of Systems Constitutional: Negative for fever and chills HENT: Negative for ear pain, congestion, and sore throat Eyes: Negative for eye pain and visual disturbance  Cardiovascular: Negative for chest pain, palpitations, and leg swelling Respiratory: Negative cough, shortness of breath, and wheezing.  Gastrointestinal: Positive for nausea. Negative for vomiting. Negative for abdominal pain. Positive for constipation Genitourinary: Negative for dysuria and hematuria Endocrine: Negative for polydipsia, polyphagia, and polyuria Musculoskeletal: Positive for back pain Neurological: Negative for dizziness and headache Skin: Negative for rash and itchiness   Psychiatric: Negative for depression   Past Medical History  Diagnosis Date  . High cholesterol   . Hypothyroidism   . Arthritis   . Anxiety   . GERD (gastroesophageal reflux disease)   . Hx of transfusion of packed red blood cells     Past Surgical History  Procedure Laterality Date  . Knee surgery Left   . Vaginal hysterectomy  1964  . Rectal surgery    . Eye surgery Bilateral     cataract  . Bladder  suspension      x 3  . Rectocele repair      History  Substance Use Topics  . Smoking status: Never Smoker   . Smokeless tobacco: Never Used  . Alcohol Use: 0.6 oz/week    1 Glasses of wine per week     Comment: RED WINE OCCASIONALLY       Medication List       This list is accurate as of: 07/02/14  8:38 PM.  Always use your most recent med list.               acetaminophen 500 MG tablet  Commonly known as:  TYLENOL  Take 1,000 mg by mouth every 6 (six) hours as needed (pain).     aspirin EC 81 MG tablet  Take 81 mg by mouth daily.     CALCIUM 600 + D PO  Take 600 mg by mouth 2 (two) times daily.     CoQ10 200 MG Caps  Take 200 mg by mouth daily.     donepezil 10 MG tablet  Commonly known as:  ARICEPT  Take 10 mg by mouth at bedtime.     Fish Oil 1200 MG Caps  Take 1,200 mg by mouth daily.     fluticasone 50 MCG/ACT nasal spray  Commonly known as:  FLONASE  Place 2 sprays into both nostrils daily.     furosemide 20 MG tablet  Commonly known as:  LASIX  Take 20 mg by mouth daily.     HYDROcodone-acetaminophen 5-325 MG per tablet  Commonly known as:  NORCO/VICODIN  Take 1-2 tablets by mouth every 4 (four) hours as needed (pain).     levothyroxine 50 MCG tablet  Commonly known as:  SYNTHROID, LEVOTHROID  Take 50 mcg by mouth daily before breakfast.     Magnesium 250 MG Tabs  Take 250 mg by mouth daily.     magnesium hydroxide 400 MG/5ML suspension  Commonly known as:  MILK OF MAGNESIA  Take 30 mLs by mouth daily as needed for mild constipation.     multivitamin with minerals Tabs tablet  Take 1 tablet by mouth daily.     omeprazole 20 MG capsule  Commonly known as:  PRILOSEC  Take 40 mg by mouth daily before breakfast.     promethazine 25 MG tablet  Commonly known as:  PHENERGAN  Take 25 mg by mouth every 6 (six) hours as needed for nausea or vomiting.     senna 8.6 MG Tabs tablet  Commonly known as:  SENOKOT  Take 2 tablets by mouth daily.      sertraline 50 MG tablet  Commonly known as:  ZOLOFT  Take 50 mg by mouth at bedtime.     simvastatin 40 MG tablet  Commonly known as:  ZOCOR  Take 40 mg by mouth at bedtime.     SYSTANE OP  Place 1 drop into both eyes daily as needed (dry eyes).     temazepam 15 MG capsule  Commonly known as:  RESTORIL  Take 15 mg by mouth at bedtime.     triamterene-hydrochlorothiazide 37.5-25 MG per tablet  Commonly known as:  MAXZIDE-25  Take 1 tablet by mouth daily.     vitamin B-12 1000 MCG tablet  Commonly known as:  CYANOCOBALAMIN  Take 1,000 mcg by mouth daily.     vitamin C 1000 MG tablet  Take 1,000 mg by mouth daily.     Vitamin D3 5000 UNITS Tabs  Take 5,000 Units by mouth daily.        Physical Exam  BP 123/65 mmHg  Pulse 70  Temp(Src) 97 F (36.1 C)  Resp 20  Ht 5' 0.5" (1.537 m)  Wt 149 lb 12.8 oz (67.949 kg)  BMI 28.76 kg/m2  Constitutional: WDWN elderly female in no acute distress. Conversant and pleasant HEENT: Normocephalic and atraumatic. PERRL. EOM intact. No icterus.  No nasal discharge or sinus tenderness. Oral mucosa moist. Posterior pharynx clear of any exudate or lesions.  Neck: Supple and nontender. No lymphadenopathy, masses, or thyromegaly. No JVD or carotid bruits. Cardiac: Normal S1, S2. RRR without appreciable murmurs, rubs, or gallops. Distal pulses intact. No dependent edema.  Lungs: No respiratory distress. Breath sounds clear bilaterally without rales, rhonchi, or wheezes. Abdomen: Audible bowel sounds in all quadrants. Soft, nontender, nondistended.   Musculoskeletal: able to move all extremities. Lower back tender to palpation. Lumbar brace in place Skin: Warm and dry. No rash noted. No erythema. Mid lower back surgical incision w/o signs of infection. Dermabond intact Neurological: Alert and oriented to self Psychiatric: Judgment and insight adequate. Appropriate mood and affect.   Labs Reviewed  CBC Latest Ref Rng 06/22/2014  WBC 4.0  - 10.5 K/uL 6.4  Hemoglobin 12.0 - 15.0 g/dL 14.0  Hematocrit 36.0 - 46.0 % 40.9  Platelets 150 - 400 K/uL 216    CMP Latest Ref Rng 06/22/2014  Glucose 70 - 99 mg/dL 96  BUN 6 - 23 mg/dL 13  Creatinine 0.50 - 1.10 mg/dL 0.91  Sodium 135 - 145 mmol/L 135  Potassium 3.5 - 5.1 mmol/L 3.6  Chloride 96 - 112 mmol/L 98  CO2 19 - 32 mmol/L 29  Calcium 8.4 - 10.5 mg/dL 9.6    Assessment & Plan 1. Lumbar stenosis with neurogenic claudication S/p bilateral lumbar decompression and fusion at L3-L4. Continue norco 5/325mg  1-2 tabs every four hours as needed with 1000mg  tylenol every six hours as needed for pain (do not exceed >4g of tylenol in 24 hr). Continue to work with PT/OT for gait/strength/balance training to restore/maximize function. Continue to f/u with neuro.   2. Constipation, unspecified constipation type Start senna s 8.6/50mg  2 tabs daily with miralax 17g daily. Encourage hydration and ambulation as tolerated. Continue to monitor.   3. Hypothyroidism, unspecified hypothyroidism type Continue levothyroxine 57mcg daily   4. Depression Continue zoloft 50mg  daily and monitor for change in mood  5. HLD (hyperlipidemia) Continue zocor 40mg  daily with fish oil 1200mg  daily  6. Insomnia Continue restoril 15mg  daily at bedtime.   7. Gastroesophageal reflux disease, esophagitis presence not specified Continue omeprazole 40mg  daily and monitor   8. Essential hypertension Continue lasix 20mg  with maxzide 37.5/25mg  daily. Continue to monitor BP and her status  9. Nausea Start promethazine 25mg  every 6 hours as needed for n/v. Encourage resident to take pain med with food. Continue to monitor  Labs ordered: cbc w/ diff, bmp   Family/Staff Communication Plan of care discussed with patient and nursing staff. Patient and nursing staff verbalized understanding and agree with plan of care. No additional questions or concerns reported.    Arthur Holms, MSN, AGNP-C [facility] [address] [place], [place] [address] [phone] [8am-5pm] After hours: [phone]

## 2014-07-06 ENCOUNTER — Non-Acute Institutional Stay (SKILLED_NURSING_FACILITY): Payer: PPO | Admitting: Internal Medicine

## 2014-07-06 DIAGNOSIS — I10 Essential (primary) hypertension: Secondary | ICD-10-CM

## 2014-07-06 DIAGNOSIS — F329 Major depressive disorder, single episode, unspecified: Secondary | ICD-10-CM | POA: Diagnosis not present

## 2014-07-06 DIAGNOSIS — E039 Hypothyroidism, unspecified: Secondary | ICD-10-CM | POA: Diagnosis not present

## 2014-07-06 DIAGNOSIS — K5901 Slow transit constipation: Secondary | ICD-10-CM | POA: Diagnosis not present

## 2014-07-06 DIAGNOSIS — K219 Gastro-esophageal reflux disease without esophagitis: Secondary | ICD-10-CM | POA: Diagnosis not present

## 2014-07-06 DIAGNOSIS — M48062 Spinal stenosis, lumbar region with neurogenic claudication: Secondary | ICD-10-CM

## 2014-07-06 DIAGNOSIS — M4806 Spinal stenosis, lumbar region: Secondary | ICD-10-CM

## 2014-07-06 DIAGNOSIS — M62838 Other muscle spasm: Secondary | ICD-10-CM | POA: Diagnosis not present

## 2014-07-06 DIAGNOSIS — G4700 Insomnia, unspecified: Secondary | ICD-10-CM

## 2014-07-06 DIAGNOSIS — F32A Depression, unspecified: Secondary | ICD-10-CM

## 2014-07-06 LAB — BASIC METABOLIC PANEL
BUN: 12 mg/dL (ref 4–21)
CREATININE: 0.7 mg/dL (ref 0.5–1.1)
Glucose: 96 mg/dL
Potassium: 3.7 mmol/L (ref 3.4–5.3)
Sodium: 137 mmol/L (ref 137–147)

## 2014-07-06 NOTE — Progress Notes (Signed)
Patient ID: Pamela Moore, female   DOB: 1928/09/28, 81 y.o.   MRN: [id number]     Facility: [facility]    PCP: Rusty Aus., MD  Code Status: full code  Allergies  Allergen Reactions  . Raloxifene Rash    Evista    Chief Complaint  Patient presents with  . New Admit To SNF     HPI:  81 year old patient is here for short term rehabilitation post hospital admission from 06/29/14 to 07/01/14 with L3-L4 lumbar stenosis with neurogenic claudication. She underwent bilateral lumbar decompression and fusion. She has PMH of HTN, hypothyroidism, GERD, OA, HLD among others. She is seen in her room today. She has not been having a good bowel movement in facility. Mentions taking miralax and stool softner at home. Has muscle spasm and back pain bit denies any numbness or tingling in her legs or hands. Last bowel movement yesterday.  Review of Systems:  Constitutional: Negative for fever, chills, diaphoresis.  HENT: Negative for headache, congestion  Respiratory: Negative for cough, shortness of breath and wheezing.   Cardiovascular: Negative for chest pain, palpitations, leg swelling.  Gastrointestinal: Negative for heartburn, nausea, vomiting, abdominal pain Genitourinary: Negative for dysuria Musculoskeletal: Negative for falls Skin: Negative for itching, rash.  Neurological: Negative for dizziness, tingling, focal weakness Psychiatric/Behavioral: Negative for depression   Past Medical History  Diagnosis Date  . High cholesterol   . Hypothyroidism   . Arthritis   . Anxiety   . GERD (gastroesophageal reflux disease)   . Hx of transfusion of packed red blood cells    Past Surgical History  Procedure Laterality Date  . Knee surgery Left   . Vaginal hysterectomy  1964  . Rectal surgery    . Eye surgery Bilateral     cataract  . Bladder suspension      x 3  . Rectocele repair     Social History:   reports that she has never smoked. She has never  used smokeless tobacco. She reports that she drinks about 0.6 oz of alcohol per week. She reports that she does not use illicit drugs.  No family history on file.  Medications: Patient's Medications  New Prescriptions   No medications on file  Previous Medications   ACETAMINOPHEN (TYLENOL) 500 MG TABLET    Take 1,000 mg by mouth every 6 (six) hours as needed (pain).   ASCORBIC ACID (VITAMIN C) 1000 MG TABLET    Take 1,000 mg by mouth daily.   ASPIRIN EC 81 MG TABLET    Take 81 mg by mouth daily.   CALCIUM CARB-CHOLECALCIFEROL (CALCIUM 600 + D PO)    Take 600 mg by mouth 2 (two) times daily.   CHOLECALCIFEROL (VITAMIN D3) 5000 UNITS TABS    Take 5,000 Units by mouth daily.   COENZYME Q10 (COQ10) 200 MG CAPS    Take 200 mg by mouth daily.   DONEPEZIL (ARICEPT) 10 MG TABLET    Take 10 mg by mouth at bedtime.   FLUTICASONE (FLONASE) 50 MCG/ACT NASAL SPRAY    Place 2 sprays into both nostrils daily.    FUROSEMIDE (LASIX) 20 MG TABLET    Take 20 mg by mouth daily.   HYDROCODONE-ACETAMINOPHEN (NORCO/VICODIN) 5-325 MG PER TABLET    Take 1-2 tablets by mouth every 4 (four) hours as needed (pain).   LEVOTHYROXINE (SYNTHROID, LEVOTHROID) 50 MCG TABLET    Take 50 mcg by mouth daily before breakfast.   MAGNESIUM 250  MG TABS    Take 250 mg by mouth daily.   MAGNESIUM HYDROXIDE (MILK OF MAGNESIA) 400 MG/5ML SUSPENSION    Take 30 mLs by mouth daily as needed for mild constipation.   MULTIPLE VITAMIN (MULTIVITAMIN WITH MINERALS) TABS TABLET    Take 1 tablet by mouth daily.   OMEGA-3 FATTY ACIDS (FISH OIL) 1200 MG CAPS    Take 1,200 mg by mouth daily.   OMEPRAZOLE (PRILOSEC) 20 MG CAPSULE    Take 40 mg by mouth daily before breakfast.    POLYETHYL GLYCOL-PROPYL GLYCOL (SYSTANE OP)    Place 1 drop into both eyes daily as needed (dry eyes).   PROMETHAZINE (PHENERGAN) 25 MG TABLET    Take 25 mg by mouth every 6 (six) hours as needed for nausea or vomiting.   SENNA (SENOKOT) 8.6 MG TABS TABLET    Take 2 tablets  by mouth daily.    SERTRALINE (ZOLOFT) 50 MG TABLET    Take 50 mg by mouth at bedtime.   SIMVASTATIN (ZOCOR) 40 MG TABLET    Take 40 mg by mouth at bedtime.    TEMAZEPAM (RESTORIL) 15 MG CAPSULE    Take 15 mg by mouth at bedtime.   TRIAMTERENE-HYDROCHLOROTHIAZIDE (MAXZIDE-25) 37.5-25 MG PER TABLET    Take 1 tablet by mouth daily.   VITAMIN B-12 (CYANOCOBALAMIN) 1000 MCG TABLET    Take 1,000 mcg by mouth daily.  Modified Medications   No medications on file  Discontinued Medications   No medications on file     Physical Exam: Filed Vitals:   07/06/14 1707  BP: 138/70  Pulse: 86  Temp: 96.4 F (35.8 C)  Resp: 18  SpO2: 95%    General- elderly female, in no acute distress Head- normocephalic, atraumatic Throat- moist mucus membrane Cardiovascular- normal s1,s2, no murmurs, palpable dorsalis pedis and radial pulses, no leg edema Respiratory- bilateral clear to auscultation, no wheeze, no rhonchi, no crackles, no use of accessory muscles Abdomen- bowel sounds present, soft, non tender Musculoskeletal- able to move all 4 extremities, has lumbar brace Neurological- no focal deficit Skin- warm and dry, back surgical incision with dermabond present Psychiatry- alert and oriented to person, place and time, normal mood and affect    Labs reviewed: Basic Metabolic Panel:  Recent Labs  06/22/14 1510  NA 135  K 3.6  CL 98  CO2 29  GLUCOSE 96  BUN 13  CREATININE 0.91  CALCIUM 9.6   Liver Function Tests: No results for input(s): AST, ALT, ALKPHOS, BILITOT, PROT, ALBUMIN in the last 8760 hours. No results for input(s): LIPASE, AMYLASE in the last 8760 hours. No results for input(s): AMMONIA in the last 8760 hours. CBC:  Recent Labs  06/22/14 1510  WBC 6.4  HGB 14.0  HCT 40.9  MCV 88.9  PLT 216   CBC Latest Ref Rng 06/22/2014  WBC 4.0 - 10.5 K/uL 6.4  Hemoglobin 12.0 - 15.0 g/dL 14.0  Hematocrit 36.0 - 46.0 % 40.9  Platelets 150 - 400 K/uL 216    CMP Latest Ref  Rng 06/22/2014  Glucose 70 - 99 mg/dL 96  BUN 6 - 23 mg/dL 13  Creatinine 0.50 - 1.10 mg/dL 0.91  Sodium 135 - 145 mmol/L 135  Potassium 3.5 - 5.1 mmol/L 3.6  Chloride 96 - 112 mmol/L 98  CO2 19 - 32 mmol/L 29  Calcium 8.4 - 10.5 mg/dL 9.6   No results found for: TSH   Assessment/Plan  Lumbar stenosis with neurogenic claudication S/p bilateral  lumbar decompression and fusion at L3-L4. Continue norco 5/325mg  1-2 tabs every four hours as needed with 1000mg  tylenol every six hours as needed for pain. Will have her work with physical therapy and occupational therapy team to help with gait training and muscle strengthening exercises.fall precautions. Skin care. Encourage to be out of bed. Has f/u with neurosurgery  Constipation D/c senna. Start senna s 2 tab daily and add miralax 17g po daily for 3 days, then daily prn only. Encouraged hydration  Muscle spasm Add robaxin 500 mg bid x 2 days, then q12h prn for muscle spasm  Essential hypertension Continue lasix 20mg  with maxzide 37.5/25mg  daily.   gerd Continue omeprazole 40mg  daily    Hypothyroidism Continue levothyroxine 43mcg daily   Depression Continue zoloft 50mg  daily   Insomnia Continue restoril 15mg  daily at bedtime.    Goals of care: short term rehabilitation   Labs/tests ordered: cbc with diff, bmp  Family/ staff Communication: reviewed care plan with patient and nursing supervisor    Blanchie Serve, MD  [facility] [phone] (Monday-Friday 8 am - 5 pm) [phone] (afterhours)   Has not had a good bowel movement Has muscle spasm

## 2014-07-08 ENCOUNTER — Other Ambulatory Visit: Payer: Self-pay

## 2014-07-08 MED ORDER — HYDROCODONE-ACETAMINOPHEN 5-325 MG PO TABS: ORAL_TABLET | ORAL | Status: DC

## 2014-07-08 NOTE — Telephone Encounter (Signed)
Rx faxed to [facility] @ [phone], phone number [phone]  

## 2014-07-09 ENCOUNTER — Non-Acute Institutional Stay (SKILLED_NURSING_FACILITY): Payer: PPO | Admitting: Internal Medicine

## 2014-07-09 DIAGNOSIS — K625 Hemorrhage of anus and rectum: Secondary | ICD-10-CM

## 2014-07-09 DIAGNOSIS — K5900 Constipation, unspecified: Secondary | ICD-10-CM

## 2014-07-09 DIAGNOSIS — D62 Acute posthemorrhagic anemia: Secondary | ICD-10-CM

## 2014-07-09 NOTE — Progress Notes (Signed)
Patient ID: Pamela Moore, female   DOB: 05/20/1929, 81 y.o.   MRN: [id number]    Facility: [facility]   Chief Complaint  Patient presents with  . Acute Visit    rectal bleed   HPI 81 y/o female pt is seen today with concerns for rectal bleed. She noticed blood in her stool 2 days back and mentions it was fresh blood. Has not had further blood in stool. Denies melena. Mentions that she had stained with her bowel movement few days back. Her stool was soft yesterday but had a hard times with her bowel movement today and felt it was in small lumps. Denies rectal pain. Of note: pt had guaiac stool x 1 yesterday and this was negative for blood.  As per pt, refused to take her stool softener yesterday as she had soft stool yesterday  ROS Denies dizziness or headache or lightheadedness Denies chest pain or dyspnea Denies nausea, vomiting, abdominal pain Denies hematuria Denies heartburn  Past Medical History  Diagnosis Date  . High cholesterol   . Hypothyroidism   . Arthritis   . Anxiety   . GERD (gastroesophageal reflux disease)   . Hx of transfusion of packed red blood cells    Medication reviewed. See [facility]  Physical exam BP 126/76 mmHg  Pulse 88  Temp(Src) 96.8 F (36 C)  Resp 18  General- elderly female in no acute distress HEENT- no pallor or icterus cvs- normal s1,s2, rrr Lungs- CTAB Abdomen- soft, non tender, no guarding or rigidity Ext- able to move all 4, has a back brace, using walker  Labs- 07/06/14 hb 10.8, hct 33, plt 307  CBC Latest Ref Rng 06/22/2014  WBC 4.0 - 10.5 K/uL 6.4  Hemoglobin 12.0 - 15.0 g/dL 14.0  Hematocrit 36.0 - 46.0 % 40.9  Platelets 150 - 400 K/uL 216   Assessment/plan  Rectal bleed Guaiac stool was negative. With her complaining of straining, hard stool and no further blood in stool, possible hemorrhoids contributing to this. Recheck guaiac stool x 2. Advised pt to take her stool softener daily as prescribed  in facility. Continue miralax and senna s as written daily. Monitor for further rectal bleed  Anemia Drop in h&h compared to preop lab from hospital. Likely post op from blood loss. Pt is currently hemodynamically stable. Recheck cbc in 1 week and monitor clinically  Constipation This with her hemorrhoids likely was cause for bleed. See above

## 2014-07-15 ENCOUNTER — Encounter: Payer: Self-pay | Admitting: Gastroenterology

## 2014-07-16 ENCOUNTER — Non-Acute Institutional Stay (SKILLED_NURSING_FACILITY): Payer: PPO | Admitting: Registered Nurse

## 2014-07-16 ENCOUNTER — Encounter: Payer: Self-pay | Admitting: Registered Nurse

## 2014-07-16 DIAGNOSIS — E039 Hypothyroidism, unspecified: Secondary | ICD-10-CM

## 2014-07-16 DIAGNOSIS — D62 Acute posthemorrhagic anemia: Secondary | ICD-10-CM

## 2014-07-16 DIAGNOSIS — E785 Hyperlipidemia, unspecified: Secondary | ICD-10-CM

## 2014-07-16 DIAGNOSIS — K219 Gastro-esophageal reflux disease without esophagitis: Secondary | ICD-10-CM

## 2014-07-16 DIAGNOSIS — K5900 Constipation, unspecified: Secondary | ICD-10-CM

## 2014-07-16 DIAGNOSIS — F329 Major depressive disorder, single episode, unspecified: Secondary | ICD-10-CM

## 2014-07-16 DIAGNOSIS — F32A Depression, unspecified: Secondary | ICD-10-CM

## 2014-07-16 DIAGNOSIS — M48062 Spinal stenosis, lumbar region with neurogenic claudication: Secondary | ICD-10-CM

## 2014-07-16 DIAGNOSIS — M4806 Spinal stenosis, lumbar region: Secondary | ICD-10-CM

## 2014-07-16 DIAGNOSIS — G4700 Insomnia, unspecified: Secondary | ICD-10-CM

## 2014-07-16 DIAGNOSIS — I10 Essential (primary) hypertension: Secondary | ICD-10-CM

## 2014-07-16 LAB — CBC AND DIFFERENTIAL
HEMATOCRIT: 38 % (ref 36–46)
HEMOGLOBIN: 12.4 g/dL (ref 12.0–16.0)
PLATELETS: 483 10*3/uL — AB (ref 150–399)
WBC: 8.1 10*3/mL

## 2014-07-16 NOTE — Progress Notes (Signed)
Patient ID: Pamela Moore, female   DOB: May 18, 1929, 81 y.o.   MRN: [id number]   Place of Service: [facility]  Allergies  Allergen Reactions  . Raloxifene Rash    Evista    Code Status: Full Code  Goals of Care: Longevity/STR  Chief Complaint  Patient presents with  . Discharge Note    HPI 81 y.o. female with PMH of HTN, hypothyroidism, GERD, OA, HLD among others is being seen for a discharge visit. She was here for short-term rehabilitation post hospital admission from 06/29/14 to 07/01/14 with L3-L4 lumbar stenosis with neurogenic claudication s/p bilateral lumbar decompression and fusion. She has worked well with therapy team is ready to be discharged home with [facility] PT/OT and DME ([facility] and 3[facility]). Seen in room today. Still have some problem with constipation. Denies any other concerns.   Review of Systems Constitutional: Negative for fever and chills HENT: Negative for ear pain, congestion, and sore throat Eyes: Negative for eye pain and visual disturbance  Cardiovascular: Negative for chest pain, palpitations, and leg swelling Respiratory: Negative cough, shortness of breath, and wheezing.  Gastrointestinal: Negative for nausea and vomiting. Negative for abdominal pain. Positive for constipation Genitourinary: Negative for dysuria and hematuria Endocrine: Negative for polydipsia, polyphagia, and polyuria Musculoskeletal: Positive for back pain Neurological: Negative for dizziness and headache Skin: Negative for rash and itchiness   Psychiatric: Negative for depression   Past Medical History  Diagnosis Date  . High cholesterol   . Hypothyroidism   . Arthritis   . Anxiety   . GERD (gastroesophageal reflux disease)   . Hx of transfusion of packed red blood cells     Past Surgical History  Procedure Laterality Date  . Knee surgery Left   . Vaginal hysterectomy  1964  . Rectal surgery    . Eye surgery Bilateral     cataract  . Bladder suspension      x 3    . Rectocele repair      History  Substance Use Topics  . Smoking status: Never Smoker   . Smokeless tobacco: Never Used  . Alcohol Use: 0.6 oz/week    1 Glasses of wine per week     Comment: RED WINE OCCASIONALLY       Medication List       This list is accurate as of: 07/16/14  2:17 PM.  Always use your most recent med list.               acetaminophen 500 MG tablet  Commonly known as:  TYLENOL  Take 1,000 mg by mouth every 6 (six) hours as needed (pain).     aspirin EC 81 MG tablet  Take 81 mg by mouth daily.     CALCIUM 600 + D PO  Take 600 mg by mouth 2 (two) times daily.     CoQ10 200 MG Caps  Take 200 mg by mouth daily.     donepezil 10 MG tablet  Commonly known as:  ARICEPT  Take 10 mg by mouth at bedtime.     Fish Oil 1200 MG Caps  Take 1,200 mg by mouth daily.     fluticasone 50 MCG/ACT nasal spray  Commonly known as:  FLONASE  Place 2 sprays into both nostrils daily.     furosemide 20 MG tablet  Commonly known as:  LASIX  Take 20 mg by mouth daily.     HYDROcodone-acetaminophen 5-325 MG per tablet  Commonly known as:  NORCO/VICODIN  1 by mouth every 4 hours as needed for moderate pain, 2 by mouth every 4 hours as needed for severe pain DO NOT EXCEED 4 GM OF APAP IN 24 HOURS     levothyroxine 50 MCG tablet  Commonly known as:  SYNTHROID, LEVOTHROID  Take 50 mcg by mouth daily before breakfast.     Magnesium 250 MG Tabs  Take 250 mg by mouth daily.     magnesium hydroxide 400 MG/5ML suspension  Commonly known as:  MILK OF MAGNESIA  Take 30 mLs by mouth daily as needed for mild constipation.     multivitamin with minerals Tabs tablet  Take 1 tablet by mouth daily.     omeprazole 20 MG capsule  Commonly known as:  PRILOSEC  Take 40 mg by mouth daily before breakfast.     promethazine 25 MG tablet  Commonly known as:  PHENERGAN  Take 25 mg by mouth every 6 (six) hours as needed for nausea or vomiting.     senna 8.6 MG Tabs tablet   Commonly known as:  SENOKOT  Take 2 tablets by mouth daily.     sertraline 50 MG tablet  Commonly known as:  ZOLOFT  Take 50 mg by mouth at bedtime.     simvastatin 40 MG tablet  Commonly known as:  ZOCOR  Take 40 mg by mouth at bedtime.     SYSTANE OP  Place 1 drop into both eyes daily as needed (dry eyes).     temazepam 15 MG capsule  Commonly known as:  RESTORIL  Take 15 mg by mouth at bedtime.     triamterene-hydrochlorothiazide 37.5-25 MG per tablet  Commonly known as:  MAXZIDE-25  Take 1 tablet by mouth daily.     vitamin B-12 1000 MCG tablet  Commonly known as:  CYANOCOBALAMIN  Take 1,000 mcg by mouth daily.     vitamin C 1000 MG tablet  Take 1,000 mg by mouth daily.     Vitamin D3 5000 UNITS Tabs  Take 5,000 Units by mouth daily.        Physical Exam  BP 145/75 mmHg  Pulse 93  Temp(Src) 97.5 F (36.4 C)  Resp 20  Ht 5' (1.524 m)  Wt 149 lb 12.8 oz (67.949 kg)  BMI 29.26 kg/m2  SpO2 95%  Constitutional: WDWN elderly female in no acute distress. Conversant and pleasant HEENT: Normocephalic and atraumatic. PERRL. EOM intact. No icterus.  No nasal discharge or sinus tenderness. Oral mucosa moist. Posterior pharynx clear of any exudate or lesions.  Neck: Supple and nontender. No lymphadenopathy, masses, or thyromegaly. No JVD or carotid bruits. Cardiac: Normal S1, S2. RRR without appreciable murmurs, rubs, or gallops. Distal pulses intact. No dependent edema.  Lungs: No respiratory distress. Breath sounds clear bilaterally without rales, rhonchi, or wheezes. Abdomen: Audible bowel sounds in all quadrants. Soft, nontender, nondistended.   Musculoskeletal: able to move all extremities. Lower back tender to palpation. Lumbar brace in place Skin: Warm and dry. No rash noted. No erythema. Mid lower back surgical incision healing well w/o signs of infection.  Neurological: Alert and oriented to self Psychiatric: Judgment and insight adequate. Appropriate mood  and affect.   Labs Reviewed  CBC Latest Ref Rng 07/16/2014 06/22/2014  WBC - 8.1 6.4  Hemoglobin 12.0 - 16.0 g/dL 12.4 14.0  Hematocrit 36 - 46 % 38 40.9  Platelets 150 - 399 K/L 483(A) 216    CMP Latest Ref Rng 07/06/2014 06/22/2014  Glucose 70 -  99 mg/dL - 96  BUN 4 - 21 mg/dL 12 13  Creatinine 0.5 - 1.1 mg/dL 0.7 0.91  Sodium 137 - 147 mmol/L 137 135  Potassium 3.4 - 5.3 mmol/L 3.7 3.6  Chloride 96 - 112 mmol/L - 98  CO2 19 - 32 mmol/L - 29  Calcium 8.4 - 10.5 mg/dL - 9.6    Assessment & Plan 1. Lumbar stenosis with neurogenic claudication S/p bilateral lumbar decompression and fusion at L3-L4. Continue norco 5/325mg  1-2 tabs every four hours as needed with 1000mg  tylenol every six hours as needed for pain (do not exceed >4g of tylenol in 24 hr). [facility] for gait/strength/balance training to restore/maximize function. Continue to f/u with neuro.   2. Constipation, unspecified constipation type Stable. Continue senna s 8.6/50mg  2 tabs daily with miralax 17g daily. Encourage hydration and ambulation as tolerated. F/u with PCP  3. Hypothyroidism, unspecified hypothyroidism type Stable. Continue levothyroxine 90mcg daily.   4. Depression Mood stable. Continue zoloft 50mg  daily.   5. HLD (hyperlipidemia) Stable. Continue zocor 40mg  daily with fish oil 1200mg  daily  6. Insomnia Stable. Continue restoril 15mg  daily at bedtime.   7. Gastroesophageal reflux disease, esophagitis presence not specified No issues. Continue omeprazole 40mg  daily.   8. Essential hypertension BP mostly in 140s/80s. Continue lasix 20mg  with maxzide 37.5/25mg  daily. Encourage to keep BP diary and f/u with PCP  9. Acute blood loss anemia h&h improved. Most recent h&h 12.4/38.   Home health services: PT/OT DME required: [employer], 3-1 PCP follow-up: Dr. Sabra Heck on 07/21/14 @ 10:45am 30-day supply of prescription medications provided (#30 of norco 5/325mg )  Family/Staff  Communication Plan of care discussed with patient and nursing staff. Patient and nursing staff verbalized understanding and agree with plan of care. No additional questions or concerns reported.    Arthur Holms, MSN, AGNP-C [facility] [address] [place], [place] [address] [phone] [8am-5pm] After hours: [phone]

## 2014-08-12 ENCOUNTER — Ambulatory Visit: Payer: Medicare Other | Admitting: Gastroenterology

## 2014-09-08 NOTE — Op Note (Signed)
PATIENT NAME:  Pamela Moore, Pamela Moore MR#:  [id number] DATE OF BIRTH:  December 29, 1928  DATE OF PROCEDURE:  04/08/2012  PREOPERATIVE DIAGNOSIS: Degenerative arthrosis of the left knee.   POSTOPERATIVE DIAGNOSIS: Degenerative arthrosis of the left knee.   PROCEDURE PERFORMED: Left total knee arthroplasty using computer-assisted navigation.   SURGEON: Laurice Record. Hooten, M.D.   ASSISTANT: Vance Peper, PA-C (required to maintain retraction throughout the procedure)   ANESTHESIA: Femoral nerve block and spinal.   ESTIMATED BLOOD LOSS: 50 mL.   FLUIDS REPLACED: 1300 mL crystalloid.   TOURNIQUET TIME: 81 minutes.   DRAINS: Two medium drains to reinfusion system.   SOFT TISSUE RELEASES: Anterior cruciate ligament, posterior cruciate ligament, deep medial collateral ligament, and patellofemoral ligament.   IMPLANTS UTILIZED: DePuy PFC Sigma size 3 posterior stabilized femoral component (cemented), size 3 MBT tibial component (cemented), 32 mm three peg oval dome patella (cemented), and a 10 mm stabilized rotating platform polyethylene insert.   INDICATIONS FOR SURGERY: The patient is an 81 year old female who has been seen for complaints of progressive left knee pain. X-rays demonstrated severe degenerative changes in tricompartmental fashion with relative varus deformity. She did not see any significant improvement despite conservative nonsurgical intervention. After discussion of the risks and benefits of surgical intervention, the patient expressed her understanding of the risks and benefits and agreed with plans for surgical intervention.   PROCEDURE IN DETAIL: The patient was brought into the Operating Room and, after adequate femoral nerve block and spinal anesthesia was achieved, a tourniquet was placed on the patient's upper left thigh. The patient's left knee and leg were cleaned and prepped with alcohol and DuraPrep and draped in the usual sterile fashion. A "time out" was performed as per usual  protocol. The left lower extremity was exsanguinated using an Esmarch, and the tourniquet was inflated to 300 mmHg. An anterior longitudinal incision was made followed by a standard mid vastus approach. A moderate effusion was evacuated. The deep fibers of the medial collateral ligament were elevated in subperiosteal fashion off the medial flare of the tibia so as to maintain a continuous soft tissue sleeve. The patella was subluxed laterally and the patellofemoral ligament was incised. Inspection of the knee demonstrated severe degenerative changes with full thickness loss of articular cartilage noted especially to the medial compartment. Prominent osteophytes were debrided using rongeur. Anterior and posterior cruciate ligaments were excised. Two 4.0 mm Schanz pins were inserted into the femur and into the tibia for attachment of the ray of trackers used for computer-assisted navigation. Hip center was identified using a circumduction technique. Distal landmarks were mapped using the computer. The distal femur and proximal tibia were mapped using the computer. Distal femoral cutting guide was positioned using computer-assisted navigation so as to achieve a 5 degree distal valgus cut. Cut was performed and verified using the computer. The distal femur was sized and it was felt that a size 3 femoral component was appropriate. Size 3 cutting guide was positioned and the anterior cut was performed and verified using the computer. This was followed by completion of the posterior and chamfer cuts. Femoral cutting guide for the central box was then positioned and central box cut was performed.   Attention was then directed to the proximal tibia. Medial and lateral menisci were excised. The extramedullary tibial cutting guide was positioned using computer-assisted navigation so as to achieve a 0 degree varus valgus alignment and 0 degree posterior slope. Cut was performed and verified using the computer. The proximal  tibia  was sized and it was felt that a size 3 tibial tray was appropriate. Tibial and femoral trials were inserted followed by insertion of a 10 mm polyethylene insert. Excellent mediolateral soft tissue balancing was appreciated both in full extension and in flexion. Finally, the patella was cut and prepared so as to accommodate a 32 mm three peg oval dome patella. Patellar trial was placed and the knee was placed through a range of motion with excellent patellar tracking appreciated.   Femoral trial was removed. Central post hole for the tibial component was reamed followed by insertion of a keel punch. Femoral trials were then removed. The cut surfaces of bone were irrigated with copious amounts of normal saline with antibiotic solution using pulsatile lavage and then suctioned dry. Polymethyl methacrylate cement was prepared in the usual fashion using a vacuum mixer. Cement was applied to the cut surface of the proximal tibia as well as along the undersurface of a size 3 MBT tibial component. The tibial component was positioned and impacted into place. Excess cement was removed using freer elevators. Cement was then applied to the cut surface of the femur as well as along the posterior flanges of a size 3 posterior stabilized femoral component. Femoral component was positioned and impacted into place. Excess cement was removed using freer elevators. A 10 mm polyethylene trial was inserted and the knee was brought in full extension with steady axial compression applied. Finally, cement was applied to the backside of a 32 mm three peg oval dome patella and the patellar component was positioned and patellar clamp applied. Excess cement was removed using freer elevators.   After adequate curing of cement, the tourniquet was deflated after total tourniquet time of 81 minutes. Hemostasis was achieved using electrocautery. The knee was irrigated with copious amounts of normal saline with antibiotic solution using  pulsatile lavage and then suctioned dry. The knee was inspected for any residual cement debris. 30 mL of 0.25% Marcaine with epinephrine was injected along the posterior capsule. A 10 mm stabilized rotating platform polyethylene insert was inserted and the knee was placed through a range of motion. Excellent patellar tracking was appreciated and excellent mediolateral soft tissue balancing was appreciated. Two medium drains were placed in the wound bed and brought out through a separate stab incision to be attached to the reinfusion system. The medial parapatellar portion of the incision was reapproximated using interrupted sutures of #1 Vicryl. The subcutaneous tissue was approximated in layers using first #0 Vicryl followed by #2-0 Vicryl. Skin was closed with skin staples. A sterile dressing was applied.   The patient tolerated the procedure well. She was transported to the Recovery Room in stable condition.  ____________________________ Laurice Record. Holley Bouche., MD [id number] D: 04/09/2012 30:86:57 ET T: 04/09/2012 09:35:23 ET JOB#: [id number]  cc: Laurice Record. Holley Bouche., MD, <Dictator> JAMES P Holley Bouche MD ELECTRONICALLY SIGNED 04/09/2012 22:35

## 2014-09-08 NOTE — Discharge Summary (Signed)
PATIENT NAME:  Pamela Moore, Pamela Moore MR#:  [id number] DATE OF BIRTH:  [birth year]  DATE OF ADMISSION:  04/08/2012 DATE OF DISCHARGE:  04/11/2012  ADMITTING DIAGNOSIS: Degenerative arthrosis of the left knee.   DISCHARGE DIAGNOSIS: Degenerative arthrosis of the left knee.   HISTORY OF PRESENT ILLNESS: The patient is an 81 year old who has been followed at [facility] for progression of left knee pain over the last 2 to 3 years. She had localized most of the pain along the medial aspect of the knee. She denied any specific trauma or aggravating event. The patient on occasion was noted to have some swelling of the knee. At the time of surgery, she was not using any ambulatory aid. The patient had not seen any significant improvement in her condition despite the use of anti-inflammatory medications, intraarticular cortisone injections, as well as Visco supplementation and activity modification. The patient states that the pain had progressed to the point that it was significantly interfering with her activities of daily living. X-rays taken in [place] showed narrowing of the medial cartilage space with associated varus alignment. Osteophyte as well as subchondral sclerosis was noted. After discussion of the risks and benefits of surgical intervention, the patient expressed her understanding of the risks and agreed with plans for surgical intervention.   PROCEDURE: Left total knee arthroplasty using computer-assisted navigation.   ANESTHESIA: Femoral nerve block with spinal.   SOFT TISSUE RELEASE: Anterior cruciate ligament, posterior cruciate ligament, deep and medial collateral ligaments as well as the patellofemoral ligament.   IMPLANTS UTILIZED: DePuy PFC Sigma size three posterior stabilized femoral component (cemented), size three MBT tibial component (cemented), 32-mm three-pegged oval dome patella (cemented), and a 10-mm stabilized rotating platform polyethylene insert.    HOSPITAL COURSE: The patient tolerated the procedure very well. She had no complications. She was then taken to the PAC-U where she was stabilized and then transferred to the orthopedic floor. She began receiving anticoagulation therapy of Lovenox 30 mg subcutaneous every 12 hours per anesthesia and pharmacy protocol. She was fitted with TED stockings bilaterally. These were allowed to be removed one hour per eight-hour shift. The left one was applied on day two following removal of the Hemovac and dressing change. The patient was also fitted with AVI compression foot pumps bilaterally set at 80 mmHg. Her calves have been nontender. There has been no evidence of any deep venous thromboses. Negative Homans sign. Heels were elevated off the bed using rolled towels.   The patient denied any shortness of breath or chest pains. Vital signs have been stable. She has been afebrile. Hemodynamically she was stable. No transfusions were given other than the Autovac transfusions given the first six hours postoperatively.   Physical therapy was initiated on day one for activities of daily living and assistive devices. She has progressed very nicely. Upon being transferred she was actually ambulating greater than 200 feet. She had motion of more than 85 degrees. She is pretty much independent with bed to chair transfers. Occupational therapy was also initiated on day one for activities of daily living and assistive devices. She has progressed very nicely.   The patient's IV, Foley, and Hemovac were discontinued on day two along with a dressing change. The Polar Care was reapplied to the surgical leg maintaining a temperature of 40 to 50 degrees Fahrenheit. The wound was free of any drainage or signs of infection.   DISPOSITION: The patient is being discharged to skilled nursing facility in improved stable condition.  DISCHARGE INSTRUCTIONS:  1. She may weight bear as tolerated.  2. She is to continue with PT for  gait training and transfers.  3. Occupational therapy for activities of daily living and assistive devices.  4. Incentive spirometry q.1 hour while awake.  5. Encourage cough, deep breathing q. 2 hours while awake.  6. She is placed on a regular diet.  7. Polar Care to the surgical leg maintaining a temperature of 40 to 50 degrees Fahrenheit.  8. Elevate heels off the bed.  9. TED stockings are to be worn at all times. These may be removed one hour per eight-hour shift. She has a follow-up appointment in the clinic in two weeks.   DRUG ALLERGIES: Evista.     MEDICATIONS:  1. Tylenol ES 500 to 1000 mg q. 4 hours p.r.n. for temperatures greater than 100.4 or pain. 2. Celebrex 200 mg b.i.d.  3. Roxicodone 5 to 10 mg q. 4 to 6 hours p.r.n. for pain.  4. Tramadol 50 to 100 mg q. 4 hours p.r.n. for pain.  5. Dulcolax suppository 10 mg rectally daily p.r.n. for constipation.  6. Milk of Magnesia 30 mL b.i.d. p.r.n. 7. Enema soapsuds if no results with Milk of Magnesia or Dulcolax.  8. Mylanta DS 30 mL q. 6 hours.  9. Senokot-S 1 tablet b.i.d. 10. Aricept 10 mg at bedtime.  11. Citracal plus D 1 tablet b.i.d.    12. Omega-3 fatty acid 1 gram capsule daily.  13. Folate 0.8 mg daily.  14. Lasix 20 mg daily.  15. Maxzide 25 mg daily.  16. Synthroid 0.025 mg q. 6 a.m.  17. Multivitamin capsule one per day.  18. Prilosec 40 mg q. 6 a.m.  19. Zocor 40 mg at bedtime.  20. Ascorbic acid 500 mg b.i.d.  21. Vitamin D3 5000 units daily.  22. Lovenox 30 mg subcutaneous q. 12 hours for 14 days, then discontinue and begin taking one 81-mg enteric-coated aspirin.   PAST MEDICAL HISTORY:  1. Seasonal allergies.  2. Arthritis.  3. Breast lumps.  4. Chickenpox. 5. Gallstones. 6. AR.  7. Hyperlipidemia.  8. Hypothyroidism.       9. Postmenopausal. 10. Diverticulosis.      ____________________________ Vance Peper, PA [id number] D: 04/11/2012 07:24:11 ET T: 04/11/2012 08:04:12  ET JOB#: [id number]  cc: Vance Peper, PA, <Dictator> Chereese Cilento PA ELECTRONICALLY SIGNED 04/11/2012 20:16

## 2014-11-04 ENCOUNTER — Other Ambulatory Visit: Payer: Self-pay | Admitting: Internal Medicine

## 2014-11-04 DIAGNOSIS — Z1231 Encounter for screening mammogram for malignant neoplasm of breast: Secondary | ICD-10-CM

## 2015-03-18 ENCOUNTER — Ambulatory Visit: Payer: Medicare Other

## 2015-03-18 ENCOUNTER — Ambulatory Visit
Admission: RE | Admit: 2015-03-18 | Discharge: 2015-03-18 | Disposition: A | Discharge status: Home / Self Care | Payer: PPO | Source: Ambulatory Visit | Attending: Internal Medicine | Admitting: Internal Medicine

## 2015-03-18 ENCOUNTER — Other Ambulatory Visit: Payer: Self-pay | Admitting: Internal Medicine

## 2015-03-18 DIAGNOSIS — Z1231 Encounter for screening mammogram for malignant neoplasm of breast: Secondary | ICD-10-CM

## 2015-05-27 DIAGNOSIS — J0110 Acute frontal sinusitis, unspecified: Secondary | ICD-10-CM | POA: Diagnosis not present

## 2015-07-22 DIAGNOSIS — E039 Hypothyroidism, unspecified: Secondary | ICD-10-CM | POA: Diagnosis not present

## 2015-07-22 DIAGNOSIS — Z0000 Encounter for general adult medical examination without abnormal findings: Secondary | ICD-10-CM | POA: Diagnosis not present

## 2015-07-22 DIAGNOSIS — E782 Mixed hyperlipidemia: Secondary | ICD-10-CM | POA: Diagnosis not present

## 2015-07-29 DIAGNOSIS — E782 Mixed hyperlipidemia: Secondary | ICD-10-CM | POA: Diagnosis not present

## 2015-07-29 DIAGNOSIS — Z79899 Other long term (current) drug therapy: Secondary | ICD-10-CM | POA: Diagnosis not present

## 2015-07-29 DIAGNOSIS — E039 Hypothyroidism, unspecified: Secondary | ICD-10-CM | POA: Diagnosis not present

## 2015-11-03 DIAGNOSIS — L570 Actinic keratosis: Secondary | ICD-10-CM | POA: Diagnosis not present

## 2015-11-03 DIAGNOSIS — X32XXXA Exposure to sunlight, initial encounter: Secondary | ICD-10-CM | POA: Diagnosis not present

## 2016-01-10 ENCOUNTER — Other Ambulatory Visit: Payer: Self-pay | Admitting: Internal Medicine

## 2016-01-10 DIAGNOSIS — Z1231 Encounter for screening mammogram for malignant neoplasm of breast: Secondary | ICD-10-CM

## 2016-01-28 DIAGNOSIS — H04123 Dry eye syndrome of bilateral lacrimal glands: Secondary | ICD-10-CM | POA: Diagnosis not present

## 2016-01-28 DIAGNOSIS — H1851 Endothelial corneal dystrophy: Secondary | ICD-10-CM | POA: Diagnosis not present

## 2016-01-28 DIAGNOSIS — H353131 Nonexudative age-related macular degeneration, bilateral, early dry stage: Secondary | ICD-10-CM | POA: Diagnosis not present

## 2016-02-02 DIAGNOSIS — D485 Neoplasm of uncertain behavior of skin: Secondary | ICD-10-CM | POA: Diagnosis not present

## 2016-02-02 DIAGNOSIS — B079 Viral wart, unspecified: Secondary | ICD-10-CM | POA: Diagnosis not present

## 2016-02-03 DIAGNOSIS — E782 Mixed hyperlipidemia: Secondary | ICD-10-CM | POA: Diagnosis not present

## 2016-02-10 DIAGNOSIS — Z0000 Encounter for general adult medical examination without abnormal findings: Secondary | ICD-10-CM | POA: Diagnosis not present

## 2016-02-10 DIAGNOSIS — Z23 Encounter for immunization: Secondary | ICD-10-CM | POA: Diagnosis not present

## 2016-03-02 DIAGNOSIS — J0100 Acute maxillary sinusitis, unspecified: Secondary | ICD-10-CM | POA: Diagnosis not present

## 2016-03-02 DIAGNOSIS — H1045 Other chronic allergic conjunctivitis: Secondary | ICD-10-CM | POA: Diagnosis not present

## 2016-03-02 DIAGNOSIS — H01111 Allergic dermatitis of right upper eyelid: Secondary | ICD-10-CM | POA: Diagnosis not present

## 2016-03-20 ENCOUNTER — Ambulatory Visit
Admission: RE | Admit: 2016-03-20 | Discharge: 2016-03-20 | Disposition: A | Discharge status: Home / Self Care | Payer: PPO | Source: Ambulatory Visit | Attending: Internal Medicine | Admitting: Internal Medicine

## 2016-03-20 ENCOUNTER — Other Ambulatory Visit: Payer: Self-pay | Admitting: Internal Medicine

## 2016-03-20 DIAGNOSIS — Z1231 Encounter for screening mammogram for malignant neoplasm of breast: Secondary | ICD-10-CM

## 2016-04-24 DIAGNOSIS — J0100 Acute maxillary sinusitis, unspecified: Secondary | ICD-10-CM | POA: Diagnosis not present

## 2016-08-07 DIAGNOSIS — Z0000 Encounter for general adult medical examination without abnormal findings: Secondary | ICD-10-CM | POA: Diagnosis not present

## 2016-08-07 DIAGNOSIS — E782 Mixed hyperlipidemia: Secondary | ICD-10-CM | POA: Diagnosis not present

## 2016-08-14 DIAGNOSIS — E039 Hypothyroidism, unspecified: Secondary | ICD-10-CM | POA: Insufficient documentation

## 2016-08-14 DIAGNOSIS — E782 Mixed hyperlipidemia: Secondary | ICD-10-CM | POA: Diagnosis not present

## 2016-08-14 DIAGNOSIS — Z0000 Encounter for general adult medical examination without abnormal findings: Secondary | ICD-10-CM | POA: Diagnosis not present

## 2016-11-13 DIAGNOSIS — M79675 Pain in left toe(s): Secondary | ICD-10-CM | POA: Diagnosis not present

## 2016-11-13 DIAGNOSIS — M898X9 Other specified disorders of bone, unspecified site: Secondary | ICD-10-CM | POA: Diagnosis not present

## 2016-12-01 ENCOUNTER — Other Ambulatory Visit: Payer: Self-pay | Admitting: Internal Medicine

## 2016-12-01 DIAGNOSIS — Z1231 Encounter for screening mammogram for malignant neoplasm of breast: Secondary | ICD-10-CM

## 2016-12-29 DIAGNOSIS — M1711 Unilateral primary osteoarthritis, right knee: Secondary | ICD-10-CM | POA: Diagnosis not present

## 2016-12-29 DIAGNOSIS — M25561 Pain in right knee: Secondary | ICD-10-CM | POA: Diagnosis not present

## 2017-02-14 DIAGNOSIS — E782 Mixed hyperlipidemia: Secondary | ICD-10-CM | POA: Diagnosis not present

## 2017-02-14 DIAGNOSIS — E039 Hypothyroidism, unspecified: Secondary | ICD-10-CM | POA: Diagnosis not present

## 2017-02-22 DIAGNOSIS — E039 Hypothyroidism, unspecified: Secondary | ICD-10-CM | POA: Diagnosis not present

## 2017-02-22 DIAGNOSIS — Z0000 Encounter for general adult medical examination without abnormal findings: Secondary | ICD-10-CM | POA: Diagnosis not present

## 2017-02-22 DIAGNOSIS — Z23 Encounter for immunization: Secondary | ICD-10-CM | POA: Diagnosis not present

## 2017-02-22 DIAGNOSIS — E782 Mixed hyperlipidemia: Secondary | ICD-10-CM | POA: Diagnosis not present

## 2017-03-20 DIAGNOSIS — N390 Urinary tract infection, site not specified: Secondary | ICD-10-CM | POA: Diagnosis not present

## 2017-03-21 ENCOUNTER — Ambulatory Visit
Admission: RE | Admit: 2017-03-21 | Discharge: 2017-03-21 | Disposition: A | Discharge status: Home / Self Care | Payer: PPO | Source: Ambulatory Visit | Attending: Internal Medicine | Admitting: Internal Medicine

## 2017-03-21 DIAGNOSIS — Z1231 Encounter for screening mammogram for malignant neoplasm of breast: Secondary | ICD-10-CM | POA: Insufficient documentation

## 2017-04-18 DIAGNOSIS — J019 Acute sinusitis, unspecified: Secondary | ICD-10-CM | POA: Diagnosis not present

## 2017-05-11 DIAGNOSIS — L821 Other seborrheic keratosis: Secondary | ICD-10-CM | POA: Diagnosis not present

## 2017-05-23 DIAGNOSIS — N3090 Cystitis, unspecified without hematuria: Secondary | ICD-10-CM | POA: Diagnosis not present

## 2017-05-23 DIAGNOSIS — M519 Unspecified thoracic, thoracolumbar and lumbosacral intervertebral disc disorder: Secondary | ICD-10-CM | POA: Diagnosis not present

## 2017-08-16 DIAGNOSIS — E039 Hypothyroidism, unspecified: Secondary | ICD-10-CM | POA: Diagnosis not present

## 2017-08-23 DIAGNOSIS — Z0000 Encounter for general adult medical examination without abnormal findings: Secondary | ICD-10-CM | POA: Diagnosis not present

## 2017-08-23 DIAGNOSIS — M76899 Other specified enthesopathies of unspecified lower limb, excluding foot: Secondary | ICD-10-CM | POA: Diagnosis not present

## 2017-08-23 DIAGNOSIS — Z79899 Other long term (current) drug therapy: Secondary | ICD-10-CM | POA: Diagnosis not present

## 2017-08-23 DIAGNOSIS — E782 Mixed hyperlipidemia: Secondary | ICD-10-CM | POA: Diagnosis not present

## 2017-08-23 DIAGNOSIS — E039 Hypothyroidism, unspecified: Secondary | ICD-10-CM | POA: Diagnosis not present

## 2017-10-22 DIAGNOSIS — M79675 Pain in left toe(s): Secondary | ICD-10-CM | POA: Diagnosis not present

## 2017-10-22 DIAGNOSIS — M898X9 Other specified disorders of bone, unspecified site: Secondary | ICD-10-CM | POA: Diagnosis not present

## 2017-12-17 DIAGNOSIS — H353131 Nonexudative age-related macular degeneration, bilateral, early dry stage: Secondary | ICD-10-CM | POA: Diagnosis not present

## 2018-01-04 DIAGNOSIS — M545 Low back pain: Secondary | ICD-10-CM | POA: Diagnosis not present

## 2018-01-04 DIAGNOSIS — M5442 Lumbago with sciatica, left side: Secondary | ICD-10-CM | POA: Diagnosis not present

## 2018-01-04 DIAGNOSIS — G8929 Other chronic pain: Secondary | ICD-10-CM | POA: Diagnosis not present

## 2018-01-14 DIAGNOSIS — M76899 Other specified enthesopathies of unspecified lower limb, excluding foot: Secondary | ICD-10-CM | POA: Diagnosis not present

## 2018-01-14 DIAGNOSIS — M5116 Intervertebral disc disorders with radiculopathy, lumbar region: Secondary | ICD-10-CM | POA: Diagnosis not present

## 2018-01-15 ENCOUNTER — Other Ambulatory Visit: Payer: Self-pay | Admitting: Internal Medicine

## 2018-01-15 DIAGNOSIS — Z1231 Encounter for screening mammogram for malignant neoplasm of breast: Secondary | ICD-10-CM

## 2018-02-20 DIAGNOSIS — E782 Mixed hyperlipidemia: Secondary | ICD-10-CM | POA: Diagnosis not present

## 2018-02-20 DIAGNOSIS — E039 Hypothyroidism, unspecified: Secondary | ICD-10-CM | POA: Diagnosis not present

## 2018-02-20 DIAGNOSIS — Z79899 Other long term (current) drug therapy: Secondary | ICD-10-CM | POA: Diagnosis not present

## 2018-02-27 DIAGNOSIS — M519 Unspecified thoracic, thoracolumbar and lumbosacral intervertebral disc disorder: Secondary | ICD-10-CM | POA: Diagnosis not present

## 2018-02-27 DIAGNOSIS — Z23 Encounter for immunization: Secondary | ICD-10-CM | POA: Diagnosis not present

## 2018-02-27 DIAGNOSIS — E039 Hypothyroidism, unspecified: Secondary | ICD-10-CM | POA: Diagnosis not present

## 2018-02-27 DIAGNOSIS — Z0000 Encounter for general adult medical examination without abnormal findings: Secondary | ICD-10-CM | POA: Diagnosis not present

## 2018-02-27 DIAGNOSIS — E782 Mixed hyperlipidemia: Secondary | ICD-10-CM | POA: Diagnosis not present

## 2018-02-27 DIAGNOSIS — I350 Nonrheumatic aortic (valve) stenosis: Secondary | ICD-10-CM | POA: Diagnosis not present

## 2018-03-15 ENCOUNTER — Ambulatory Visit (INDEPENDENT_AMBULATORY_CARE_PROVIDER_SITE_OTHER): Payer: PPO | Admitting: Urology

## 2018-03-15 ENCOUNTER — Encounter: Payer: Self-pay | Admitting: Urology

## 2018-03-15 ENCOUNTER — Other Ambulatory Visit: Payer: Self-pay

## 2018-03-15 VITALS — BP 132/72 | HR 92 | Ht 61.0 in | Wt 159.0 lb

## 2018-03-15 DIAGNOSIS — R350 Frequency of micturition: Secondary | ICD-10-CM | POA: Diagnosis not present

## 2018-03-15 DIAGNOSIS — N952 Postmenopausal atrophic vaginitis: Secondary | ICD-10-CM

## 2018-03-15 DIAGNOSIS — K8020 Calculus of gallbladder without cholecystitis without obstruction: Secondary | ICD-10-CM | POA: Insufficient documentation

## 2018-03-15 LAB — BLADDER SCAN AMB NON-IMAGING: Scan Result: 0

## 2018-03-15 MED ORDER — ESTROGENS, CONJUGATED 0.625 MG/GM VA CREA: TOPICAL_CREAM | VAGINAL | 12 refills | Status: AC

## 2018-03-15 MED ORDER — ESTRADIOL 0.1 MG/GM VA CREA: TOPICAL_CREAM | VAGINAL | 12 refills | Status: AC

## 2018-03-15 NOTE — Patient Instructions (Signed)
I have given you two prescriptions for a vaginal estrogen cream.  Estrace and Premarin.  Please take these to your pharmacy and see which one your insurance covers.  If both are too expensive, please call the office at [phone] for an alternative.  You are given a sample of vaginal estrogen cream Premarin and instructed to apply 0.5mg (pea-sized amount)  just inside the vaginal introitus with a finger-tip on Monday, Wednesday and Friday nights,     

## 2018-03-15 NOTE — Progress Notes (Signed)
03/15/2018 10:45 AM   Pamela Moore 1928/07/01 [id number]  Referring provider: Rusty Aus, MD [facility] [place], [place] [address]  Chief Complaint  Patient presents with  . Dysuria    HPI: Patient is a 84 year old Caucasian female who is referred to [place] by Dr. Emily Filbert for urinary frequency.    She says she suffers from constipation that started three years ago.  She feels that her bladder issues stem from the constipation.   She is having itching and burning associated with a rash in her groin area and down the inside of her legs.  She was given Nystatin cream and clotrimazole and betamethasone cream.    She feels that her bladder is not emptying.    She is having associated strong urgency, nocturia x 3, intermittency, hesitancy, straining to urinate, weak urinary stream and urge incontinence (wears pads x 4-5 daily).  Patient denies any gross hematuria, dysuria or suprapubic/flank pain.  Patient denies any fevers, chills, nausea or vomiting.  Her PVR is 0 mL.    She has had 3 bladder tacking's.  She does not have a history of urinary tract infections, a history of nephrolithiasis or GU trauma.   She is post menopausal.   She is not drinking a lot of water daily.   She is drinking four cups of coffee.  She is not drinking soda.  She is not drinking tea.  She is not drinking juices.  She does not drink any non water liquids.  She is not drinking any alcohol.     PMH: Past Medical History:  Diagnosis Date  . Anxiety   . Arthritis   . GERD (gastroesophageal reflux disease)   . High cholesterol   . Hx of transfusion of packed red blood cells   . Hypothyroidism     Surgical History: Past Surgical History:  Procedure Laterality Date  . BLADDER SUSPENSION     x 3  . BREAST BIOPSY Left 1985   negative  . EYE SURGERY Bilateral    cataract  . KNEE SURGERY Left   . RECTAL SURGERY    . [place] Medications:  Allergies as of 03/15/2018      Reactions   Raloxifene Rash   Evista      Medication List        Accurate as of 03/15/18 10:45 AM. Always use your most recent med list.          acetaminophen 500 MG tablet Commonly known as:  TYLENOL Take 1,000 mg by mouth every 6 (six) hours as needed (pain).   aspirin EC 81 MG tablet Take 81 mg by mouth daily.   CALCIUM 600 + D PO Take 600 mg by mouth 2 (two) times daily.   conjugated estrogens vaginal cream Commonly known as:  PREMARIN Apply 0.5mg  (pea-sized amount)  just inside the vaginal introitus with a finger-tip on  Monday, Wednesday and Friday nights.   CoQ10 200 MG Caps Take 200 mg by mouth daily.   donepezil 10 MG tablet Commonly known as:  ARICEPT Take 10 mg by mouth at bedtime.   estradiol 0.1 MG/GM vaginal cream Commonly known as:  ESTRACE Apply 0.5mg  (pea-sized amount)  just inside the vaginal introitus with a finger-tip on Monday, Wednesday and Friday nights.   Fish Oil 1200 MG Caps Take 1,200 mg by mouth daily.   fluticasone  50 MCG/ACT nasal spray Commonly known as:  FLONASE Place 2 sprays into both nostrils daily.   furosemide 20 MG tablet Commonly known as:  LASIX Take 20 mg by mouth daily.   HYDROcodone-acetaminophen 5-325 MG tablet Commonly known as:  NORCO/VICODIN 1 by mouth every 4 hours as needed for moderate pain, 2 by mouth every 4 hours as needed for severe pain DO NOT EXCEED 4 GM OF APAP IN 24 HOURS   levothyroxine 50 MCG tablet Commonly known as:  SYNTHROID, LEVOTHROID Take 50 mcg by mouth daily before breakfast.   Magnesium 250 MG Tabs Take 250 mg by mouth daily.   magnesium hydroxide 400 MG/5ML suspension Commonly known as:  MILK OF MAGNESIA Take 30 mLs by mouth daily as needed for mild constipation.   multivitamin with minerals Tabs tablet Take 1 tablet by mouth daily.   omeprazole 20 MG capsule Commonly known as:  PRILOSEC Take 40 mg by  mouth daily before breakfast.   promethazine 25 MG tablet Commonly known as:  PHENERGAN Take 25 mg by mouth every 6 (six) hours as needed for nausea or vomiting.   senna 8.6 MG Tabs tablet Commonly known as:  SENOKOT Take 2 tablets by mouth daily.   sertraline 50 MG tablet Commonly known as:  ZOLOFT Take 50 mg by mouth at bedtime.   simvastatin 40 MG tablet Commonly known as:  ZOCOR Take 40 mg by mouth at bedtime.   SYSTANE OP Place 1 drop into both eyes daily as needed (dry eyes).   temazepam 15 MG capsule Commonly known as:  RESTORIL Take 15 mg by mouth at bedtime.   triamterene-hydrochlorothiazide 37.5-25 MG tablet Commonly known as:  MAXZIDE-25 Take 1 tablet by mouth daily.   vitamin B-12 1000 MCG tablet Commonly known as:  CYANOCOBALAMIN Take 1,000 mcg by mouth daily.   vitamin C 1000 MG tablet Take 1,000 mg by mouth daily.   Vitamin D3 5000 units Tabs Take 5,000 Units by mouth daily.       Allergies:  Allergies  Allergen Reactions  . Raloxifene Rash    Evista    Family History: Family History  Problem Relation Age of Onset  . Breast cancer Mother 105  . Breast cancer Sister 38    Social History:  reports that she has never smoked. She has never used smokeless tobacco. She reports that she drinks about 1.0 standard drinks of alcohol per week. She reports that she does not use drugs.  ROS: UROLOGY Frequent Urination?: No Hard to postpone urination?: No(Simultaneous filing. User may not have seen previous data.) Burning/pain with urination?: No(Simultaneous filing. User may not have seen previous data.) Get up at night to urinate?: No(Simultaneous filing. User may not have seen previous data.) Leakage of urine?: Yes Urine stream starts and stops?: Yes Trouble starting stream?: Yes Do you have to strain to urinate?: Yes Blood in urine?: No Urinary tract infection?: No Sexually transmitted disease?: No Injury to kidneys or bladder?: No Painful  intercourse?: No Weak stream?: No Currently pregnant?: No Vaginal bleeding?: No Last menstrual period?: n  Gastrointestinal Nausea?: No Vomiting?: No Indigestion/heartburn?: Yes Diarrhea?: No Constipation?: Yes  Constitutional Fever: No Night sweats?: No Weight loss?: No Fatigue?: Yes  Skin Skin rash/lesions?: No Itching?: Yes  Eyes Blurred vision?: No Double vision?: No  Ears/Nose/Throat Sore throat?: No Sinus problems?: Yes  Hematologic/Lymphatic Swollen glands?: No Easy bruising?: No  Cardiovascular Leg swelling?: No Chest pain?: No  Respiratory Cough?: No Shortness of breath?: No  Endocrine  Excessive thirst?: No  Musculoskeletal Back pain?: Yes Joint pain?: Yes  Neurological Headaches?: No Dizziness?: No  Psychologic Depression?: No Anxiety?: No  Physical Exam: BP 132/72   Pulse 92   Ht 5\' 1"  (1.549 m)   Wt 159 lb (72.1 kg)   BMI 30.04 kg/m   Constitutional: Well nourished. Alert and oriented, No acute distress. HEENT: [place] AT, moist mucus membranes. Trachea midline, no masses. Cardiovascular: No clubbing, cyanosis, or edema. Respiratory: Normal respiratory effort, no increased work of breathing. GI: Abdomen is soft, non tender, non distended, no abdominal masses. Liver and spleen not palpable.  No hernias appreciated.  Stool sample for occult testing is not indicated.   GU: No CVA tenderness.  No bladder fullness or masses.  Atrophic external genitalia, normal pubic hair distribution, no lesions.  Normal urethral meatus, no lesions, no prolapse, no discharge.   Urethral caruncle.  No bladder fullness, tenderness or masses. Pale vagina mucosa, poor estrogen effect, no discharge, no lesions, poor pelvic support, grade I cystocele noted, rectocele noted shortened vaginal canal.  Cervix, uterus and adnexa are surgically absent.  Anus and perineum are without rashes or lesions.   Urinary leakage was seen on Valsalva. Skin: No rashes, bruises or  suspicious lesions. Lymph: No cervical or inguinal adenopathy. Neurologic: Grossly intact, no focal deficits, moving all 4 extremities. Psychiatric: Normal mood and affect.  Laboratory Data: Lab Results  Component Value Date   WBC 8.1 07/16/2014   HGB 12.4 07/16/2014   HCT 38 07/16/2014   MCV 88.9 06/22/2014   PLT 483 (A) 07/16/2014    Lab Results  Component Value Date   CREATININE 0.7 07/06/2014    No results found for: PSA  No results found for: TESTOSTERONE  No results found for: HGBA1C  No results found for: TSH  No results found for: CHOL, HDL, CHOLHDL, VLDL, LDLCALC  Lab Results  Component Value Date   AST 42 (H) 12/07/2013   Lab Results  Component Value Date   ALT 38 12/07/2013   No components found for: ALKALINEPHOPHATASE No components found for: BILIRUBINTOTAL  No results found for: ESTRADIOL  Urinalysis    Component Value Date/Time   COLORURINE Straw 12/07/2013 1729   APPEARANCEUR Clear 12/07/2013 1729   LABSPEC 1.003 12/07/2013 1729   PHURINE 7.0 12/07/2013 1729   GLUCOSEU Negative 12/07/2013 1729   HGBUR Negative 12/07/2013 1729   BILIRUBINUR Negative 12/07/2013 1729   KETONESUR Negative 12/07/2013 1729   PROTEINUR Negative 12/07/2013 1729   NITRITE Negative 12/07/2013 1729   LEUKOCYTESUR Negative 12/07/2013 1729    I have reviewed the labs.   Pertinent Imaging: Results for ACIRE, TANG (MRN [id number]) as of 03/15/2018 10:43  Ref. Range 03/15/2018 10:40  Scan Result Unknown 0     Assessment & Plan:    1. Frequency/Incontinence Discussed behavioral therapies, bladder training and bladder control strategies Encouraged the patient to decrease her coffee intake and increase her water intake  2. Vaginal atrophy Patient was given a sample of vaginal estrogen cream (Premarin vaginal cream) and instructed to apply 0.5mg  (pea-sized amount)  just inside the vaginal introitus with a finger-tip on Monday, Wednesday and Friday nights.  I  explained to the patient that vaginally administered estrogen, which causes only a slight increase in the blood estrogen levels, have fewer contraindications and adverse systemic effects that oral HT. I have also given prescriptions for the Estrace cream and Premarin cream, so that the patient may carry them to the pharmacy to see which  one of the branded creams would be most economical for her.  If she finds both medications cost prohibitive, she is instructed to call the office.  We can then call in a compounded vaginal estrogen cream for the patient that may be more affordable.  She will follow up in one month for an exam.     Return in about 1 month (around 04/15/2018) for recheck and PVR .  These notes generated with voice recognition software. I apologize for typographical errors.  Zara Council, PA-C  [facility] [address] [place], [place] [address] [phone]

## 2018-03-18 DIAGNOSIS — M76899 Other specified enthesopathies of unspecified lower limb, excluding foot: Secondary | ICD-10-CM | POA: Diagnosis not present

## 2018-03-18 DIAGNOSIS — I350 Nonrheumatic aortic (valve) stenosis: Secondary | ICD-10-CM | POA: Diagnosis not present

## 2018-03-22 ENCOUNTER — Ambulatory Visit
Admission: RE | Admit: 2018-03-22 | Discharge: 2018-03-22 | Disposition: A | Discharge status: Home / Self Care | Payer: PPO | Source: Ambulatory Visit | Attending: Internal Medicine | Admitting: Internal Medicine

## 2018-03-22 DIAGNOSIS — Z1231 Encounter for screening mammogram for malignant neoplasm of breast: Secondary | ICD-10-CM | POA: Diagnosis not present

## 2018-04-08 ENCOUNTER — Ambulatory Visit: Payer: Self-pay | Admitting: Urology

## 2018-04-15 DIAGNOSIS — M48061 Spinal stenosis, lumbar region without neurogenic claudication: Secondary | ICD-10-CM | POA: Diagnosis not present

## 2018-04-15 DIAGNOSIS — M5116 Intervertebral disc disorders with radiculopathy, lumbar region: Secondary | ICD-10-CM | POA: Diagnosis not present

## 2018-04-16 ENCOUNTER — Ambulatory Visit: Payer: BLUE CROSS/BLUE SHIELD | Admitting: Urology

## 2018-04-17 ENCOUNTER — Other Ambulatory Visit: Payer: Self-pay | Admitting: Internal Medicine

## 2018-04-17 DIAGNOSIS — S22080A Wedge compression fracture of T11-T12 vertebra, initial encounter for closed fracture: Secondary | ICD-10-CM

## 2018-04-19 ENCOUNTER — Ambulatory Visit: Payer: PPO

## 2018-04-22 ENCOUNTER — Ambulatory Visit: Payer: PPO

## 2018-04-24 ENCOUNTER — Ambulatory Visit
Admission: RE | Admit: 2018-04-24 | Discharge: 2018-04-24 | Disposition: A | Discharge status: Home / Self Care | Payer: PPO | Source: Ambulatory Visit | Attending: Internal Medicine | Admitting: Internal Medicine

## 2018-04-24 DIAGNOSIS — X58XXXA Exposure to other specified factors, initial encounter: Secondary | ICD-10-CM | POA: Diagnosis not present

## 2018-04-24 DIAGNOSIS — M47816 Spondylosis without myelopathy or radiculopathy, lumbar region: Secondary | ICD-10-CM | POA: Insufficient documentation

## 2018-04-24 DIAGNOSIS — M5127 Other intervertebral disc displacement, lumbosacral region: Secondary | ICD-10-CM | POA: Diagnosis not present

## 2018-04-24 DIAGNOSIS — R609 Edema, unspecified: Secondary | ICD-10-CM | POA: Insufficient documentation

## 2018-04-24 DIAGNOSIS — S22080A Wedge compression fracture of T11-T12 vertebra, initial encounter for closed fracture: Secondary | ICD-10-CM | POA: Diagnosis not present

## 2018-04-24 DIAGNOSIS — M48061 Spinal stenosis, lumbar region without neurogenic claudication: Secondary | ICD-10-CM | POA: Insufficient documentation

## 2018-04-26 ENCOUNTER — Ambulatory Visit: Payer: BLUE CROSS/BLUE SHIELD | Admitting: Urology

## 2018-04-29 DIAGNOSIS — M542 Cervicalgia: Secondary | ICD-10-CM | POA: Diagnosis not present

## 2018-04-29 DIAGNOSIS — S22000A Wedge compression fracture of unspecified thoracic vertebra, initial encounter for closed fracture: Secondary | ICD-10-CM | POA: Diagnosis not present

## 2018-04-30 ENCOUNTER — Other Ambulatory Visit: Payer: Self-pay

## 2018-04-30 ENCOUNTER — Encounter
Admission: RE | Admit: 2018-04-30 | Discharge: 2018-04-30 | Disposition: A | Discharge status: Home / Self Care | Payer: PPO | Source: Ambulatory Visit | Attending: Orthopedic Surgery | Admitting: Orthopedic Surgery

## 2018-04-30 DIAGNOSIS — M542 Cervicalgia: Secondary | ICD-10-CM | POA: Diagnosis not present

## 2018-04-30 DIAGNOSIS — Z01810 Encounter for preprocedural cardiovascular examination: Secondary | ICD-10-CM | POA: Insufficient documentation

## 2018-04-30 DIAGNOSIS — E039 Hypothyroidism, unspecified: Secondary | ICD-10-CM | POA: Diagnosis not present

## 2018-04-30 DIAGNOSIS — M5137 Other intervertebral disc degeneration, lumbosacral region: Secondary | ICD-10-CM | POA: Diagnosis not present

## 2018-04-30 DIAGNOSIS — M4854XA Collapsed vertebra, not elsewhere classified, thoracic region, initial encounter for fracture: Secondary | ICD-10-CM | POA: Diagnosis not present

## 2018-04-30 DIAGNOSIS — K5790 Diverticulosis of intestine, part unspecified, without perforation or abscess without bleeding: Secondary | ICD-10-CM | POA: Diagnosis not present

## 2018-04-30 DIAGNOSIS — K219 Gastro-esophageal reflux disease without esophagitis: Secondary | ICD-10-CM | POA: Diagnosis not present

## 2018-04-30 DIAGNOSIS — I452 Bifascicular block: Secondary | ICD-10-CM

## 2018-04-30 DIAGNOSIS — Z79899 Other long term (current) drug therapy: Secondary | ICD-10-CM | POA: Diagnosis not present

## 2018-04-30 DIAGNOSIS — E785 Hyperlipidemia, unspecified: Secondary | ICD-10-CM | POA: Diagnosis not present

## 2018-04-30 NOTE — Patient Instructions (Signed)
Your procedure is scheduled on: Thurs 12/12 Report to Day Surgery. To find out your arrival time please call [phone] between [place] on  Wed. 12/11.  Remember: Instructions that are not followed completely may result in serious medical risk,  up to and including death, or upon the discretion of your surgeon and anesthesiologist your  surgery may need to be rescheduled.     _X__ 1. Do not eat food after midnight the night before your procedure.                 No gum chewing or hard candies. You may drink clear liquids up to 2 hours                 before you are scheduled to arrive for your surgery- DO not drink clear                 liquids within 2 hours of the start of your surgery.                 Clear Liquids include:  water, apple juice without pulp, clear carbohydrate                 drink such as Clearfast of Gatorade, Black Coffee or Tea (Do not add                 anything to coffee or tea).  __X__2.  On the morning of surgery brush your teeth with toothpaste and water, you                may rinse your mouth with mouthwash if you wish.  Do not swallow any toothpaste of mouthwash.     _X__ 3.  No Alcohol for 24 hours before or after surgery.   ___ 4.  Do Not Smoke or use e-cigarettes For 24 Hours Prior to Your Surgery.                 Do not use any chewable tobacco products for at least 6 hours prior to                 surgery.  ____  5.  Bring all medications with you on the day of surgery if instructed.   _x___  6.  Notify your doctor if there is any change in your medical condition      (cold, fever, infections).     Do not wear jewelry, make-up, hairpins, clips or nail polish. Do not wear lotions, powders, or perfumes. You may wear deodorant. Do not shave 48 hours prior to surgery. Men may shave face and neck. Do not bring valuables to the hospital.    [facility] is not responsible for any belongings or valuables.  Contacts,  dentures or bridgework may not be worn into surgery. Leave your suitcase in the car. After surgery it may be brought to your room. For patients admitted to the hospital, discharge time is determined by your treatment team.   Patients discharged the day of surgery will not be allowed to drive home.   Please read over the following fact sheets that you were given:    __x__ Take these medicines the morning of surgery with A SIP OF WATER:    1. omeprazole (PRILOSEC) 20 MG capsule take extra dose the night before and one the morning of surgery  2. levothyroxine (SYNTHROID, LEVOTHROID) 75 MCG tablet  3. acetaminophen (TYLENOL) 500 MG tablet if needed  4.fluticasone (FLONASE) 50 MCG/ACT nasal spray  5.  6.  ____ Fleet Enema (as directed)   __x__ Use CHG Soap as directed  ____ Use inhalers on the day of surgery  ____ Stop metformin 2 days prior to surgery    ____ Take 1/2 of usual insulin dose the night before surgery. No insulin the morning          of surgery.   ____ Stop Coumadin/Plavix/aspirin on   __x__ Stop Anti-inflammatories  etodolac (LODINE) 400 MG tablet now   _x___ Stop supplements until after surgery.  Omega-3 Fatty Acids (FISH OIL) 1200 MG CAPS   ____ Bring C-Pap to the hospital.

## 2018-05-02 ENCOUNTER — Other Ambulatory Visit: Payer: Self-pay

## 2018-05-02 ENCOUNTER — Ambulatory Visit
Admission: RE | Admit: 2018-05-02 | Discharge: 2018-05-02 | Disposition: A | Discharge status: Home / Self Care | Payer: PPO | Attending: Orthopedic Surgery | Admitting: Orthopedic Surgery

## 2018-05-02 ENCOUNTER — Encounter: Payer: Self-pay | Admitting: Anesthesiology

## 2018-05-02 ENCOUNTER — Encounter
Admission: RE | Disposition: A | Discharge status: Home / Self Care | Payer: Self-pay | Source: Home / Self Care | Attending: Orthopedic Surgery

## 2018-05-02 ENCOUNTER — Ambulatory Visit: Payer: PPO

## 2018-05-02 ENCOUNTER — Ambulatory Visit: Payer: PPO | Admitting: Anesthesiology

## 2018-05-02 DIAGNOSIS — K219 Gastro-esophageal reflux disease without esophagitis: Secondary | ICD-10-CM | POA: Diagnosis not present

## 2018-05-02 DIAGNOSIS — E039 Hypothyroidism, unspecified: Secondary | ICD-10-CM | POA: Insufficient documentation

## 2018-05-02 DIAGNOSIS — M5137 Other intervertebral disc degeneration, lumbosacral region: Secondary | ICD-10-CM | POA: Insufficient documentation

## 2018-05-02 DIAGNOSIS — Z419 Encounter for procedure for purposes other than remedying health state, unspecified: Secondary | ICD-10-CM

## 2018-05-02 DIAGNOSIS — M4854XA Collapsed vertebra, not elsewhere classified, thoracic region, initial encounter for fracture: Secondary | ICD-10-CM | POA: Insufficient documentation

## 2018-05-02 DIAGNOSIS — Z79899 Other long term (current) drug therapy: Secondary | ICD-10-CM | POA: Insufficient documentation

## 2018-05-02 DIAGNOSIS — E7800 Pure hypercholesterolemia, unspecified: Secondary | ICD-10-CM | POA: Diagnosis not present

## 2018-05-02 DIAGNOSIS — E782 Mixed hyperlipidemia: Secondary | ICD-10-CM | POA: Diagnosis not present

## 2018-05-02 DIAGNOSIS — E785 Hyperlipidemia, unspecified: Secondary | ICD-10-CM | POA: Insufficient documentation

## 2018-05-02 DIAGNOSIS — Z981 Arthrodesis status: Secondary | ICD-10-CM | POA: Diagnosis not present

## 2018-05-02 DIAGNOSIS — S22080A Wedge compression fracture of T11-T12 vertebra, initial encounter for closed fracture: Secondary | ICD-10-CM | POA: Diagnosis not present

## 2018-05-02 DIAGNOSIS — K5790 Diverticulosis of intestine, part unspecified, without perforation or abscess without bleeding: Secondary | ICD-10-CM | POA: Insufficient documentation

## 2018-05-02 DIAGNOSIS — M542 Cervicalgia: Secondary | ICD-10-CM | POA: Insufficient documentation

## 2018-05-02 HISTORY — PX: KYPHOPLASTY: SHX5884

## 2018-05-02 SURGERY — KYPHOPLASTY
Anesthesia: General

## 2018-05-02 MED ORDER — LIDOCAINE HCL 1 % IJ SOLN
INTRAMUSCULAR | Status: DC | PRN
  Administered 2018-05-02: 30 mL

## 2018-05-02 MED ORDER — FENTANYL CITRATE (PF) 100 MCG/2ML IJ SOLN
INTRAMUSCULAR | Status: DC | PRN
  Administered 2018-05-02: 50 ug via INTRAVENOUS

## 2018-05-02 MED ORDER — OXYCODONE HCL 5 MG PO TABS
ORAL_TABLET | ORAL | Status: AC
  Filled 2018-05-02: qty 1

## 2018-05-02 MED ORDER — IOPAMIDOL (ISOVUE-M 200) INJECTION 41%
INTRAMUSCULAR | Status: DC | PRN
  Administered 2018-05-02: 20 mL

## 2018-05-02 MED ORDER — CEFAZOLIN SODIUM-DEXTROSE 2-4 GM/100ML-% IV SOLN
2.0000 g | Freq: Once | INTRAVENOUS | Status: AC
  Administered 2018-05-02: 2 g via INTRAVENOUS

## 2018-05-02 MED ORDER — LACTATED RINGERS IV SOLN
INTRAVENOUS | Status: DC
  Administered 2018-05-02: 11:00:00 via INTRAVENOUS

## 2018-05-02 MED ORDER — OXYCODONE HCL 5 MG PO TABS
5.0000 mg | ORAL_TABLET | Freq: Once | ORAL | Status: AC | PRN
  Administered 2018-05-02: 5 mg via ORAL

## 2018-05-02 MED ORDER — LIDOCAINE 2% (20 MG/ML) 5 ML SYRINGE
INTRAMUSCULAR | Status: DC | PRN
  Administered 2018-05-02: 25 mg via INTRAVENOUS

## 2018-05-02 MED ORDER — FENTANYL CITRATE (PF) 100 MCG/2ML IJ SOLN
25.0000 ug | INTRAMUSCULAR | Status: AC | PRN
  Administered 2018-05-02 (×6): 25 ug via INTRAVENOUS

## 2018-05-02 MED ORDER — KETAMINE HCL 50 MG/ML IJ SOLN
INTRAMUSCULAR | Status: DC | PRN
  Administered 2018-05-02: 15 mg via INTRAMUSCULAR
  Administered 2018-05-02: 10 mg via INTRAVENOUS

## 2018-05-02 MED ORDER — PROPOFOL 500 MG/50ML IV EMUL
INTRAVENOUS | Status: DC | PRN
  Administered 2018-05-02: 25 ug/kg/min via INTRAVENOUS

## 2018-05-02 MED ORDER — BUPIVACAINE-EPINEPHRINE (PF) 0.5% -1:200000 IJ SOLN
INTRAMUSCULAR | Status: DC | PRN
  Administered 2018-05-02: 20 mL via PERINEURAL

## 2018-05-02 MED ORDER — OXYCODONE HCL 5 MG/5ML PO SOLN: 5.0000 mg | Freq: Once | ORAL | Status: AC | PRN

## 2018-05-02 MED ORDER — FENTANYL CITRATE (PF) 100 MCG/2ML IJ SOLN
INTRAMUSCULAR | Status: AC
  Administered 2018-05-02: 25 ug via INTRAVENOUS
  Filled 2018-05-02: qty 2

## 2018-05-02 MED ORDER — HYDROCODONE-ACETAMINOPHEN 5-325 MG PO TABS: 1.0000 | ORAL_TABLET | Freq: Four times a day (QID) | ORAL | 0 refills | Status: DC | PRN

## 2018-05-02 MED ORDER — PROPOFOL 10 MG/ML IV BOLUS
INTRAVENOUS | Status: DC | PRN
  Administered 2018-05-02: 20 mg via INTRAVENOUS

## 2018-05-02 MED ORDER — FENTANYL CITRATE (PF) 100 MCG/2ML IJ SOLN
INTRAMUSCULAR | Status: AC
  Filled 2018-05-02: qty 2

## 2018-05-02 MED ORDER — CEFAZOLIN SODIUM-DEXTROSE 2-4 GM/100ML-% IV SOLN
INTRAVENOUS | Status: AC
  Filled 2018-05-02: qty 100

## 2018-05-02 SURGICAL SUPPLY — 16 items

## 2018-05-02 NOTE — Transfer of Care (Signed)
Immediate Anesthesia Transfer of Care Note  Patient: Pamela Moore  Procedure(s) Performed: KYPHOPLASTY T12 (N/A )  Patient Location: PACU  Anesthesia Type:General  Level of Consciousness: awake and alert   Airway & Oxygen Therapy: Patient Spontanous Breathing and Patient connected to nasal cannula oxygen  Post-op Assessment: Report given to RN and Post -op Vital signs reviewed and stable  Post vital signs: Reviewed  Last Vitals:  Vitals Value Taken Time  BP 117/53 05/02/2018 12:56 PM  Temp    Pulse 79 05/02/2018 12:57 PM  Resp 8 05/02/2018 12:57 PM  SpO2 100 % 05/02/2018 12:57 PM  Vitals shown include unvalidated device data.  Last Pain:  Vitals:   05/02/18 1018  TempSrc: Temporal  PainSc: 4          Complications: No apparent anesthesia complications

## 2018-05-02 NOTE — H&P (Signed)
Reviewed paper H+P, will be scanned into chart. No changes noted.  

## 2018-05-02 NOTE — Anesthesia Postprocedure Evaluation (Signed)
Anesthesia Post Note  Patient: Pamela Moore  Procedure(s) Performed: KYPHOPLASTY T12 (N/A )  Patient location during evaluation: PACU Anesthesia Type: General Level of consciousness: awake and alert Pain management: pain level controlled Vital Signs Assessment: post-procedure vital signs reviewed and stable Respiratory status: spontaneous breathing, nonlabored ventilation, respiratory function stable and patient connected to nasal cannula oxygen Cardiovascular status: blood pressure returned to baseline and stable Postop Assessment: no apparent nausea or vomiting Anesthetic complications: no     Last Vitals:  Vitals:   05/02/18 1414 05/02/18 1527  BP: (!) 150/64 (!) 145/54  Pulse: 81 90  Resp: 15 16  Temp: 36.6 C (!) 36.2 C  SpO2: 100% 100%    Last Pain:  Vitals:   05/02/18 1527  TempSrc: Temporal  PainSc: 0-No pain                 Precious Haws Piscitello

## 2018-05-02 NOTE — Anesthesia Post-op Follow-up Note (Signed)
Anesthesia QCDR form completed.        

## 2018-05-02 NOTE — Discharge Instructions (Addendum)
Take it easy today and tomorrow and then resume more normal activities on Saturday.  Remove Band-Aids on Saturday okay to shower after that.  Pain medicine as directed.  AMBULATORY SURGERY  DISCHARGE INSTRUCTIONS   1) The drugs that you were given will stay in your system until tomorrow so for the next 24 hours you should not:  A) Drive an automobile B) Make any legal decisions C) Drink any alcoholic beverage   2) You may resume regular meals tomorrow.  Today it is better to start with liquids and gradually work up to solid foods.  You may eat anything you prefer, but it is better to start with liquids, then soup and crackers, and gradually work up to solid foods.   3) Please notify your doctor immediately if you have any unusual bleeding, trouble breathing, redness and pain at the surgery site, drainage, fever, or pain not relieved by medication.   4) Additional Instructions:Take pain medication as needed.  Oxycodone given at 2:30 pm.  Next available dose at 8pm.  Begin stool softeners along with pain medication. Have some solid food in you stomach prior to taking Norco.   No heavy lifting or bending for next few day.  You may shower tomorrow but pat the incision area dry and replace the bandaid.      Please contact your physician with any problems or Same Day Surgery at [phone], Monday through Friday 6 am to 4 pm, or [place] at [facility] number at [phone].

## 2018-05-02 NOTE — Anesthesia Preprocedure Evaluation (Signed)
Anesthesia Evaluation  Patient identified by MRN, date of birth, ID band Patient awake    Reviewed: Allergy & Precautions, H&P , NPO status , Patient's Chart, lab work & pertinent test results  History of Anesthesia Complications Negative for: history of anesthetic complications  Airway Mallampati: III  TM Distance: <3 FB Neck ROM: limited    Dental  (+) Chipped, Poor Dentition, Missing   Pulmonary neg pulmonary ROS, neg shortness of breath,           Cardiovascular Exercise Tolerance: Good (-) angina(-) Past MI and (-) DOE negative cardio ROS       Neuro/Psych PSYCHIATRIC DISORDERS negative neurological ROS     GI/Hepatic Neg liver ROS, GERD  Medicated and Controlled,  Endo/Other  Hypothyroidism   Renal/GU negative Renal ROS  negative genitourinary   Musculoskeletal  (+) Arthritis ,   Abdominal   Peds  Hematology negative hematology ROS (+)   Anesthesia Other Findings Past Medical History: No date: Anxiety No date: Arthritis No date: GERD (gastroesophageal reflux disease) No date: High cholesterol No date: Hx of transfusion of packed red blood cells No date: Hypothyroidism  Past Surgical History: No date: BLADDER SUSPENSION     Comment:  x 3 1985: BREAST BIOPSY; Left     Comment:  negative No date: EYE SURGERY; Bilateral     Comment:  cataract No date: JOINT REPLACEMENT; Left     Comment:  total knee No date: KNEE SURGERY; Left No date: RECTAL SURGERY No date: RECTOCELE REPAIR No date: SHOULDER ARTHROSCOPY; Left 1964: VAGINAL HYSTERECTOMY  BMI    Body Mass Index:  29.55 kg/m      Reproductive/Obstetrics negative OB ROS                             Anesthesia Physical Anesthesia Plan  ASA: III  Anesthesia Plan: General   Post-op Pain Management:    Induction: Intravenous  PONV Risk Score and Plan: Propofol infusion and TIVA  Airway Management Planned:  Natural Airway and Nasal Cannula  Additional Equipment:   Intra-op Plan:   Post-operative Plan:   Informed Consent: I have reviewed the patients History and Physical, chart, labs and discussed the procedure including the risks, benefits and alternatives for the proposed anesthesia with the patient or authorized representative who has indicated his/her understanding and acceptance.   Dental Advisory Given  Plan Discussed with: Anesthesiologist, CRNA and Surgeon  Anesthesia Plan Comments: (Patient consented for risks of anesthesia including but not limited to:  - adverse reactions to medications - risk of intubation if required - damage to teeth, lips or other oral mucosa - sore throat or hoarseness - Damage to heart, brain, lungs or loss of life  Patient voiced understanding.)        Anesthesia Quick Evaluation

## 2018-05-02 NOTE — Op Note (Signed)
Date  05/02/18  Time 1:05 pm   PATIENT: Pamela Moore   PRE-OPERATIVE DIAGNOSIS:  closed wedge compression fracture of T12   POST-OPERATIVE DIAGNOSIS:  closed wedge compression fracture of T12   PROCEDURE:  Procedure(s): KYPHOPLASTY T12  SURGEON: Laurene Footman, MD   ASSISTANTS: None   ANESTHESIA:   local and MAC   EBL:  No intake/output data recorded.   BLOOD ADMINISTERED:none   DRAINS: none    LOCAL MEDICATIONS USED:  MARCAINE    and XYLOCAINE    SPECIMEN:   Vertebral body of T12 biopsy   DISPOSITION OF SPECIMEN:  Pathology   COUNTS:  YES   TOURNIQUET:  * No tourniquets in log *   IMPLANTS: Bone cement   DICTATION: .Dragon Dictation  patient was brought to the operating room and after adequate anesthesia was obtained the patient was placed prone.  C arm was brought in in good visualization of the affected level obtained on both AP and lateral projections.  After patient identification and timeout procedures were completed, local anesthetic was infiltrated with 10 cc 1% Xylocaine infiltrated subcutaneously.  This is done the area on the right side of the planned approach.  The back was then prepped and draped in the usual sterile manner and repeat timeout procedure carried out.  A spinal needle was brought down to the pedicle on the right and left side of  T12 and a 50-50 mix of 1% Xylocaine half percent Sensorcaine with epinephrine total of 20 cc injected.  After allowing this to set a small incision was made and the trocar was advanced into the vertebral body in an transpedicular fashion.  Biopsy was attained Drilling was carried out balloon inserted with inflation to  2 cc, this balloon was let down and that identical access was carried on the left side and the balloon inflated to 2 cc as well.  When the cement was appropriate consistency 5 cc were injected into the vertebral body with extravasation small amount of cement into the T12-L1 disc space as the fracture line  extended through this area, good fill superior to inferior endplates and from right to left sides along the inferior endplate.  After the cement had set the trochar was removed and permanent C-arm views obtained.  The wound was closed with Dermabond followed by Band-Aid   PLAN OF CARE: Discharge to home after PACU   PATIENT DISPOSITION:  PACU - hemodynamically stable.

## 2018-05-03 LAB — SURGICAL PATHOLOGY

## 2018-05-17 DIAGNOSIS — S22000A Wedge compression fracture of unspecified thoracic vertebra, initial encounter for closed fracture: Secondary | ICD-10-CM | POA: Diagnosis not present

## 2018-05-28 DIAGNOSIS — G8929 Other chronic pain: Secondary | ICD-10-CM | POA: Diagnosis not present

## 2018-05-28 DIAGNOSIS — M545 Low back pain: Secondary | ICD-10-CM | POA: Diagnosis not present

## 2018-05-28 DIAGNOSIS — Z9889 Other specified postprocedural states: Secondary | ICD-10-CM | POA: Diagnosis not present

## 2018-05-31 DIAGNOSIS — G8929 Other chronic pain: Secondary | ICD-10-CM | POA: Diagnosis not present

## 2018-05-31 DIAGNOSIS — M545 Low back pain: Secondary | ICD-10-CM | POA: Diagnosis not present

## 2018-06-04 DIAGNOSIS — G8929 Other chronic pain: Secondary | ICD-10-CM | POA: Diagnosis not present

## 2018-06-04 DIAGNOSIS — M545 Low back pain: Secondary | ICD-10-CM | POA: Diagnosis not present

## 2018-06-07 DIAGNOSIS — G8929 Other chronic pain: Secondary | ICD-10-CM | POA: Diagnosis not present

## 2018-06-07 DIAGNOSIS — M545 Low back pain: Secondary | ICD-10-CM | POA: Diagnosis not present

## 2018-06-11 DIAGNOSIS — G8929 Other chronic pain: Secondary | ICD-10-CM | POA: Diagnosis not present

## 2018-06-11 DIAGNOSIS — M545 Low back pain: Secondary | ICD-10-CM | POA: Diagnosis not present

## 2018-06-14 DIAGNOSIS — M545 Low back pain: Secondary | ICD-10-CM | POA: Diagnosis not present

## 2018-06-14 DIAGNOSIS — G8929 Other chronic pain: Secondary | ICD-10-CM | POA: Diagnosis not present

## 2018-06-18 DIAGNOSIS — M545 Low back pain: Secondary | ICD-10-CM | POA: Diagnosis not present

## 2018-06-18 DIAGNOSIS — G8929 Other chronic pain: Secondary | ICD-10-CM | POA: Diagnosis not present

## 2018-06-21 DIAGNOSIS — G8929 Other chronic pain: Secondary | ICD-10-CM | POA: Diagnosis not present

## 2018-06-21 DIAGNOSIS — M545 Low back pain: Secondary | ICD-10-CM | POA: Diagnosis not present

## 2018-06-25 DIAGNOSIS — G8929 Other chronic pain: Secondary | ICD-10-CM | POA: Diagnosis not present

## 2018-06-25 DIAGNOSIS — M545 Low back pain: Secondary | ICD-10-CM | POA: Diagnosis not present

## 2018-07-02 DIAGNOSIS — M545 Low back pain: Secondary | ICD-10-CM | POA: Diagnosis not present

## 2018-07-02 DIAGNOSIS — G8929 Other chronic pain: Secondary | ICD-10-CM | POA: Diagnosis not present

## 2018-07-05 DIAGNOSIS — G8929 Other chronic pain: Secondary | ICD-10-CM | POA: Diagnosis not present

## 2018-07-05 DIAGNOSIS — M545 Low back pain: Secondary | ICD-10-CM | POA: Diagnosis not present

## 2018-07-18 DIAGNOSIS — G8929 Other chronic pain: Secondary | ICD-10-CM | POA: Diagnosis not present

## 2018-07-18 DIAGNOSIS — M545 Low back pain: Secondary | ICD-10-CM | POA: Diagnosis not present

## 2018-08-19 DIAGNOSIS — J0100 Acute maxillary sinusitis, unspecified: Secondary | ICD-10-CM | POA: Diagnosis not present

## 2018-08-26 DIAGNOSIS — M5116 Intervertebral disc disorders with radiculopathy, lumbar region: Secondary | ICD-10-CM | POA: Diagnosis not present

## 2018-08-26 DIAGNOSIS — N179 Acute kidney failure, unspecified: Secondary | ICD-10-CM | POA: Diagnosis not present

## 2018-08-26 DIAGNOSIS — R51 Headache: Secondary | ICD-10-CM | POA: Diagnosis not present

## 2018-08-26 DIAGNOSIS — M1612 Unilateral primary osteoarthritis, left hip: Secondary | ICD-10-CM | POA: Diagnosis not present

## 2018-08-26 DIAGNOSIS — E1142 Type 2 diabetes mellitus with diabetic polyneuropathy: Secondary | ICD-10-CM | POA: Diagnosis not present

## 2018-08-26 DIAGNOSIS — I480 Paroxysmal atrial fibrillation: Secondary | ICD-10-CM | POA: Diagnosis not present

## 2018-08-26 DIAGNOSIS — D649 Anemia, unspecified: Secondary | ICD-10-CM | POA: Diagnosis not present

## 2018-08-26 DIAGNOSIS — Z0000 Encounter for general adult medical examination without abnormal findings: Secondary | ICD-10-CM | POA: Diagnosis not present

## 2018-08-26 DIAGNOSIS — R109 Unspecified abdominal pain: Secondary | ICD-10-CM | POA: Diagnosis not present

## 2018-08-26 DIAGNOSIS — I6930 Unspecified sequelae of cerebral infarction: Secondary | ICD-10-CM | POA: Diagnosis not present

## 2018-08-26 DIAGNOSIS — J029 Acute pharyngitis, unspecified: Secondary | ICD-10-CM | POA: Diagnosis not present

## 2018-08-26 DIAGNOSIS — M7062 Trochanteric bursitis, left hip: Secondary | ICD-10-CM | POA: Diagnosis not present

## 2018-08-26 DIAGNOSIS — G4733 Obstructive sleep apnea (adult) (pediatric): Secondary | ICD-10-CM | POA: Diagnosis not present

## 2018-08-26 DIAGNOSIS — Z79899 Other long term (current) drug therapy: Secondary | ICD-10-CM | POA: Diagnosis not present

## 2018-08-26 DIAGNOSIS — I10 Essential (primary) hypertension: Secondary | ICD-10-CM | POA: Diagnosis not present

## 2018-08-26 DIAGNOSIS — E039 Hypothyroidism, unspecified: Secondary | ICD-10-CM | POA: Diagnosis not present

## 2018-08-26 DIAGNOSIS — M7989 Other specified soft tissue disorders: Secondary | ICD-10-CM | POA: Diagnosis not present

## 2018-08-26 DIAGNOSIS — E785 Hyperlipidemia, unspecified: Secondary | ICD-10-CM | POA: Diagnosis not present

## 2018-08-26 DIAGNOSIS — R05 Cough: Secondary | ICD-10-CM | POA: Diagnosis not present

## 2018-08-26 DIAGNOSIS — N189 Chronic kidney disease, unspecified: Secondary | ICD-10-CM | POA: Diagnosis not present

## 2018-08-26 DIAGNOSIS — M519 Unspecified thoracic, thoracolumbar and lumbosacral intervertebral disc disorder: Secondary | ICD-10-CM | POA: Diagnosis not present

## 2019-02-13 ENCOUNTER — Other Ambulatory Visit: Payer: Self-pay | Admitting: Internal Medicine

## 2019-02-13 DIAGNOSIS — Z1231 Encounter for screening mammogram for malignant neoplasm of breast: Secondary | ICD-10-CM

## 2019-02-25 DIAGNOSIS — E039 Hypothyroidism, unspecified: Secondary | ICD-10-CM | POA: Diagnosis not present

## 2019-02-25 DIAGNOSIS — E782 Mixed hyperlipidemia: Secondary | ICD-10-CM | POA: Diagnosis not present

## 2019-03-05 DIAGNOSIS — E782 Mixed hyperlipidemia: Secondary | ICD-10-CM | POA: Diagnosis not present

## 2019-03-05 DIAGNOSIS — F0390 Unspecified dementia without behavioral disturbance: Secondary | ICD-10-CM | POA: Diagnosis not present

## 2019-03-05 DIAGNOSIS — Z0000 Encounter for general adult medical examination without abnormal findings: Secondary | ICD-10-CM | POA: Diagnosis not present

## 2019-03-05 DIAGNOSIS — I350 Nonrheumatic aortic (valve) stenosis: Secondary | ICD-10-CM | POA: Diagnosis not present

## 2019-03-18 DIAGNOSIS — D2272 Melanocytic nevi of left lower limb, including hip: Secondary | ICD-10-CM | POA: Diagnosis not present

## 2019-03-18 DIAGNOSIS — L821 Other seborrheic keratosis: Secondary | ICD-10-CM | POA: Diagnosis not present

## 2019-03-18 DIAGNOSIS — D225 Melanocytic nevi of trunk: Secondary | ICD-10-CM | POA: Diagnosis not present

## 2019-03-24 ENCOUNTER — Ambulatory Visit
Admission: RE | Admit: 2019-03-24 | Discharge: 2019-03-24 | Disposition: A | Discharge status: Home / Self Care | Payer: PPO | Source: Ambulatory Visit | Attending: Internal Medicine | Admitting: Internal Medicine

## 2019-03-24 DIAGNOSIS — Z1231 Encounter for screening mammogram for malignant neoplasm of breast: Secondary | ICD-10-CM | POA: Insufficient documentation

## 2019-04-14 ENCOUNTER — Other Ambulatory Visit: Payer: Self-pay

## 2019-04-14 DIAGNOSIS — Z20822 Contact with and (suspected) exposure to covid-19: Secondary | ICD-10-CM

## 2019-04-15 LAB — NOVEL CORONAVIRUS, NAA: SARS-CoV-2, NAA: NOT DETECTED

## 2019-05-01 DIAGNOSIS — N644 Mastodynia: Secondary | ICD-10-CM | POA: Diagnosis not present

## 2019-05-01 DIAGNOSIS — M5116 Intervertebral disc disorders with radiculopathy, lumbar region: Secondary | ICD-10-CM | POA: Diagnosis not present

## 2019-08-27 DIAGNOSIS — E782 Mixed hyperlipidemia: Secondary | ICD-10-CM | POA: Diagnosis not present

## 2019-09-03 DIAGNOSIS — E039 Hypothyroidism, unspecified: Secondary | ICD-10-CM | POA: Diagnosis not present

## 2019-09-03 DIAGNOSIS — E782 Mixed hyperlipidemia: Secondary | ICD-10-CM | POA: Diagnosis not present

## 2019-09-03 DIAGNOSIS — F0390 Unspecified dementia without behavioral disturbance: Secondary | ICD-10-CM | POA: Diagnosis not present

## 2019-09-03 DIAGNOSIS — Z0000 Encounter for general adult medical examination without abnormal findings: Secondary | ICD-10-CM | POA: Diagnosis not present

## 2019-09-19 DIAGNOSIS — H353131 Nonexudative age-related macular degeneration, bilateral, early dry stage: Secondary | ICD-10-CM | POA: Diagnosis not present

## 2019-12-10 ENCOUNTER — Telehealth: Payer: Self-pay

## 2019-12-10 NOTE — Patient Outreach (Signed)
[facility]) Care Management  12/10/2019  Pamela Moore 1928-12-15 [id number]  Patient called. Patient stated she received a message on her voicemail from someone named Iris with [employer] and that they wanted to go over her medications. Patient stated that the person that left a message was speaking fast and she could not get the call back number down correctly. I do not see any documentation in patient's chart. I sent this message to [facility] to review and assist.

## 2020-01-15 ENCOUNTER — Other Ambulatory Visit: Payer: Self-pay | Admitting: Internal Medicine

## 2020-01-30 DIAGNOSIS — R079 Chest pain, unspecified: Secondary | ICD-10-CM | POA: Diagnosis not present

## 2020-01-30 DIAGNOSIS — N644 Mastodynia: Secondary | ICD-10-CM | POA: Diagnosis not present

## 2020-02-02 ENCOUNTER — Other Ambulatory Visit: Payer: Self-pay | Admitting: Physician Assistant

## 2020-02-02 DIAGNOSIS — N644 Mastodynia: Secondary | ICD-10-CM

## 2020-02-05 ENCOUNTER — Other Ambulatory Visit: Payer: Self-pay | Admitting: Physician Assistant

## 2020-02-05 DIAGNOSIS — N644 Mastodynia: Secondary | ICD-10-CM

## 2020-03-01 DIAGNOSIS — E782 Mixed hyperlipidemia: Secondary | ICD-10-CM | POA: Diagnosis not present

## 2020-03-08 DIAGNOSIS — Z0000 Encounter for general adult medical examination without abnormal findings: Secondary | ICD-10-CM | POA: Diagnosis not present

## 2020-03-08 DIAGNOSIS — E782 Mixed hyperlipidemia: Secondary | ICD-10-CM | POA: Diagnosis not present

## 2020-03-08 DIAGNOSIS — E039 Hypothyroidism, unspecified: Secondary | ICD-10-CM | POA: Diagnosis not present

## 2020-03-13 IMAGING — MG DIGITAL SCREENING BILAT W/ TOMO
6 of 10 series · 6 of 30 positions shown · non-contrast
Comparison: Previous exam(s).

CLINICAL DATA: Screening.

EXAM:
DIGITAL SCREENING BILATERAL MAMMOGRAM WITH TOMO AND CAD

[L CC synth-2D]
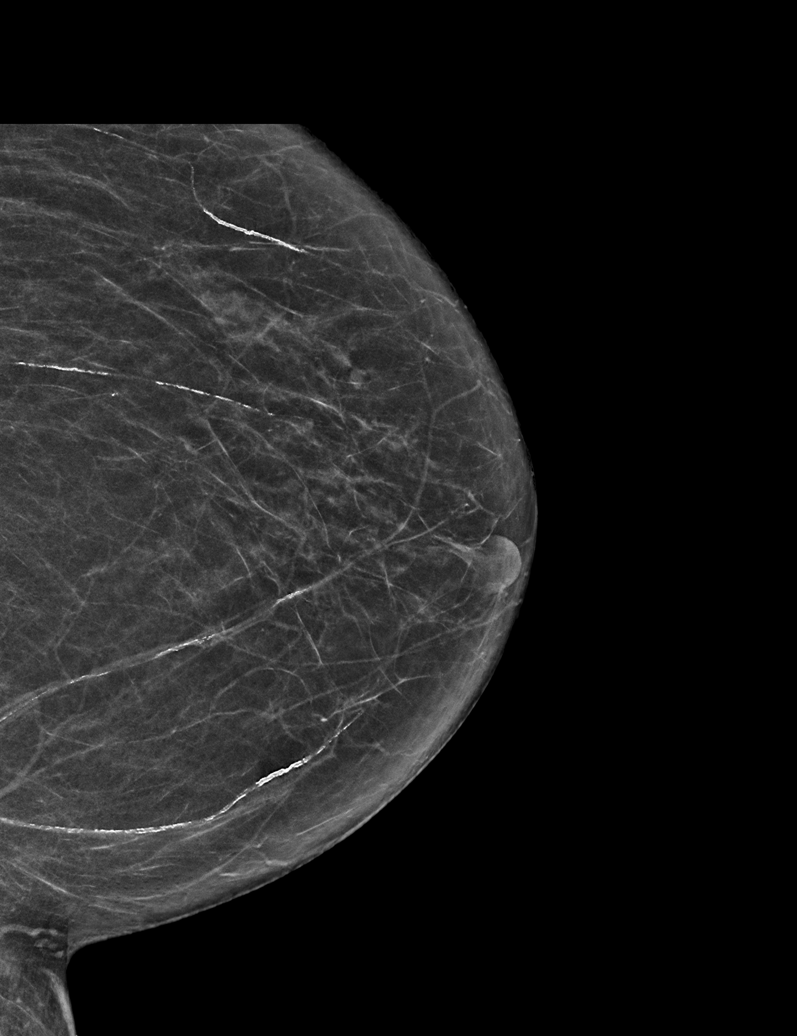

[R MLO synth-2D]
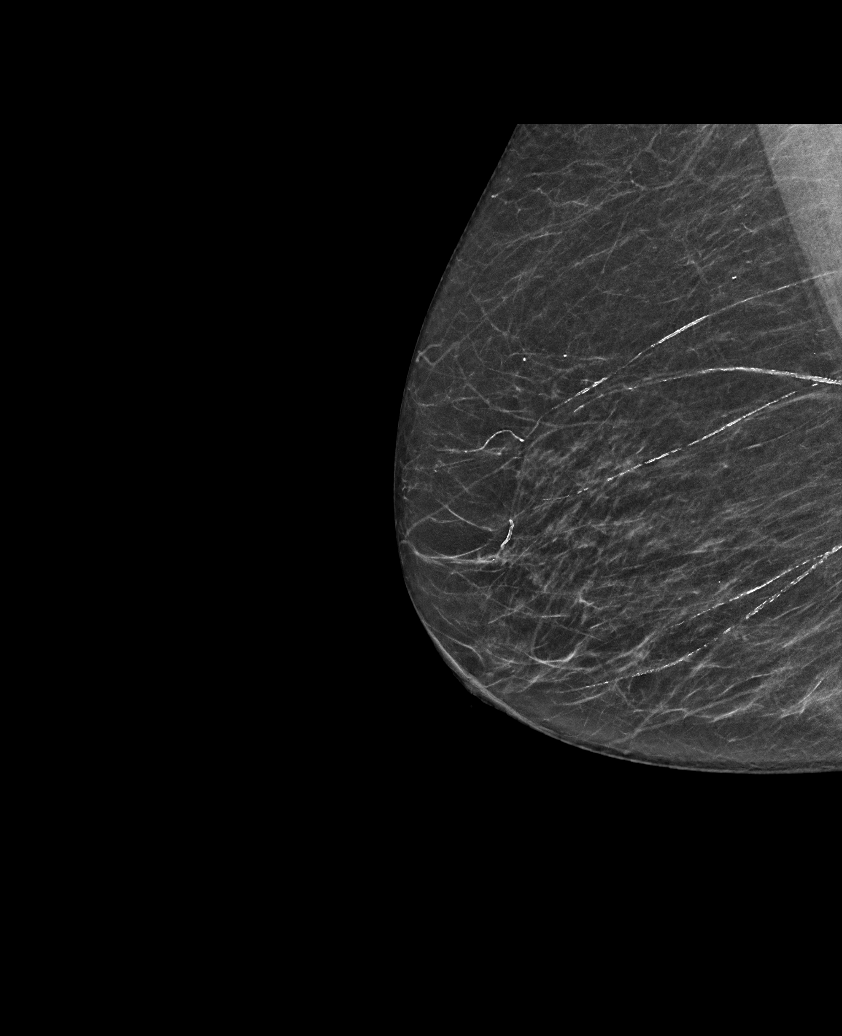

[L MLO synth-2D (1 of 2)]
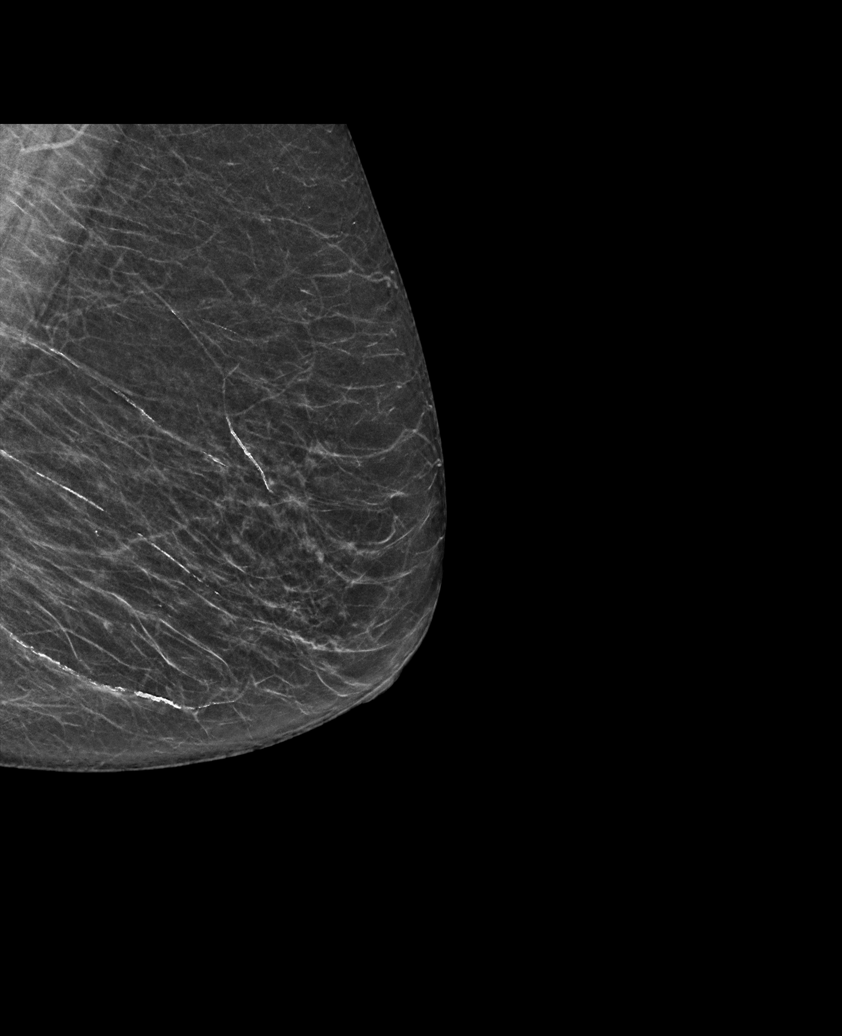

[R CC synth-2D]
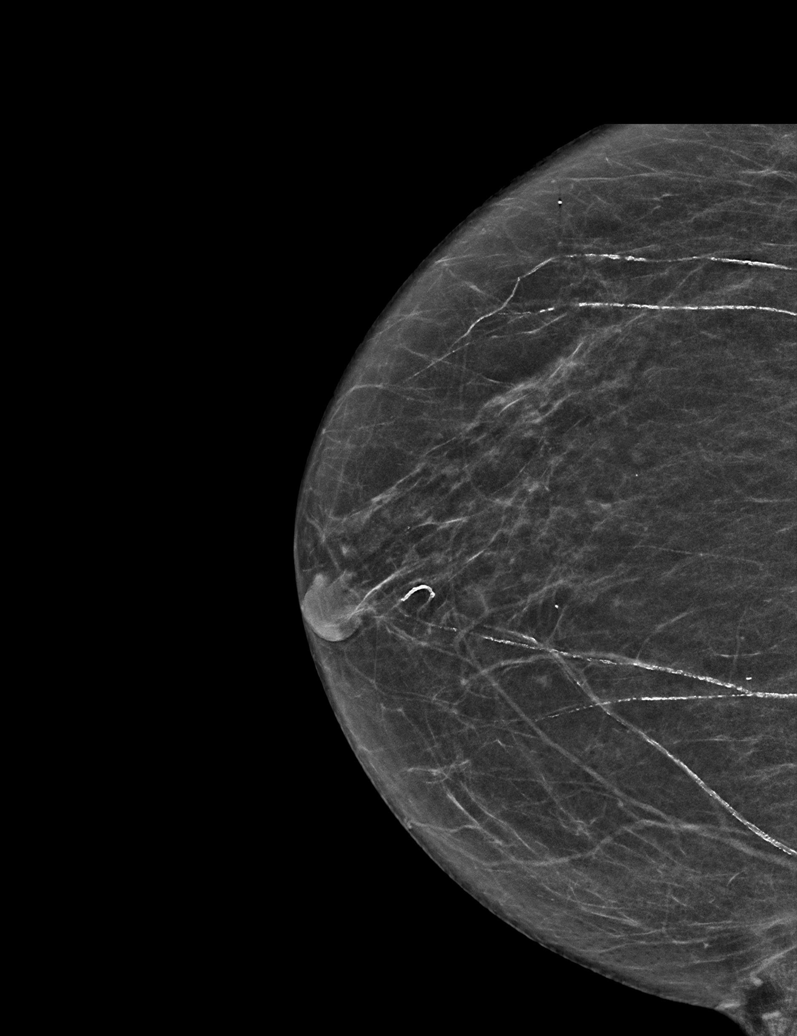

[L MLO synth-2D (2 of 2)]
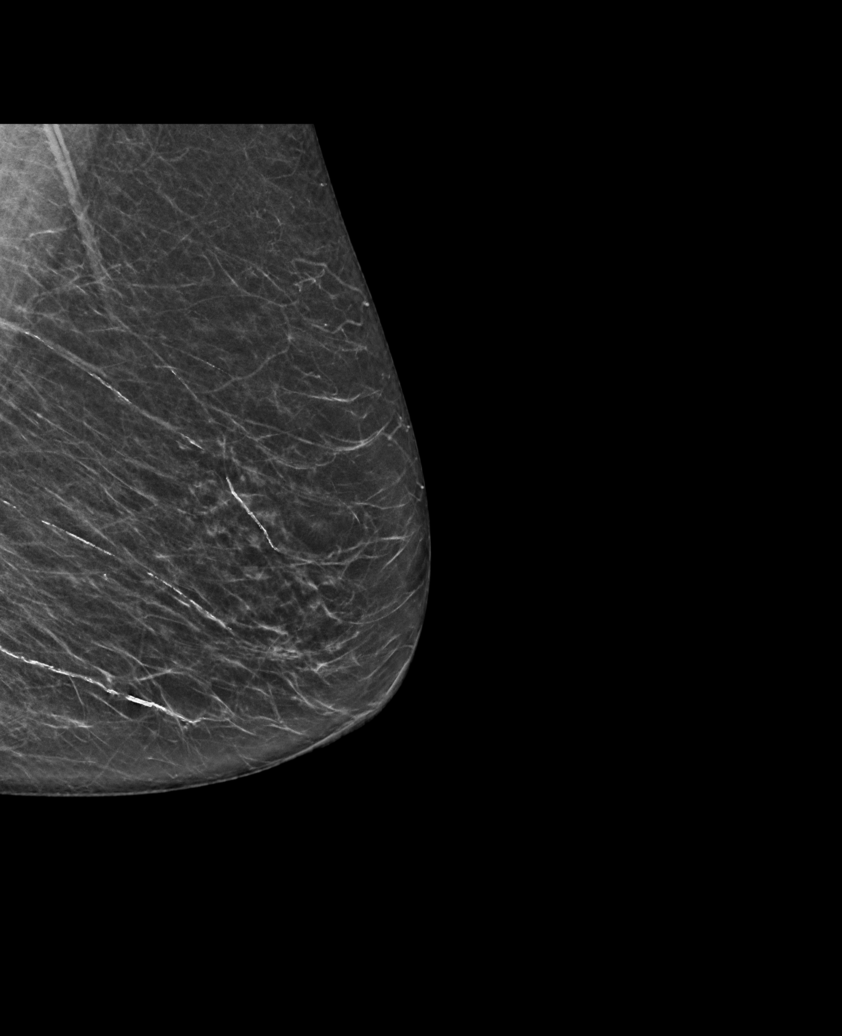

[L MLO tomo · tomo slice 27/53.0]
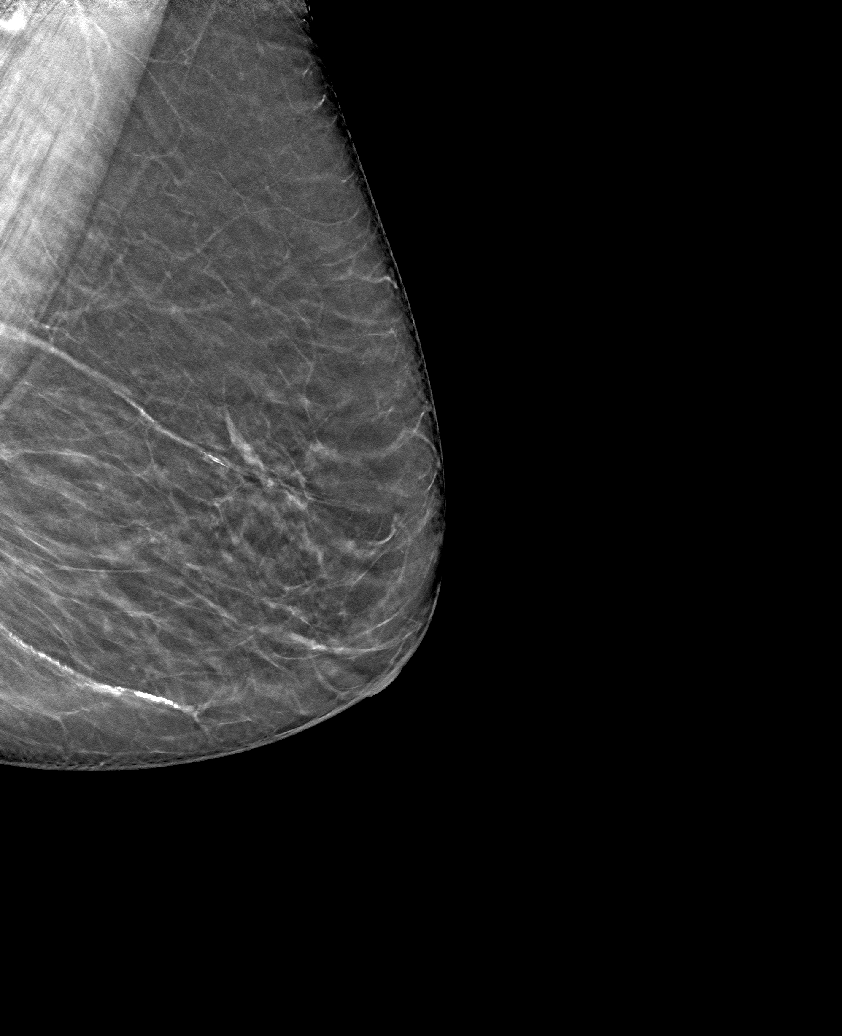

[6 of 30 positions shown; findings below may reference images not displayed]

ACR Breast Density Category b: There are scattered areas of
fibroglandular density.
FINDINGS: There are no findings suspicious for malignancy. Images were
processed with CAD.
IMPRESSION: No mammographic evidence of malignancy. A result letter of this
screening mammogram will be mailed directly to the patient.

RECOMMENDATION:
Screening mammogram in one year. (Code:[id number])

BI-RADS CATEGORY  1: Negative.

## 2020-03-25 ENCOUNTER — Ambulatory Visit
Admission: RE | Admit: 2020-03-25 | Discharge: 2020-03-25 | Disposition: A | Discharge status: Home / Self Care | Payer: PPO | Source: Ambulatory Visit | Attending: Physician Assistant | Admitting: Physician Assistant

## 2020-03-25 ENCOUNTER — Other Ambulatory Visit: Payer: Self-pay

## 2020-03-25 DIAGNOSIS — N644 Mastodynia: Secondary | ICD-10-CM

## 2020-05-10 DIAGNOSIS — R208 Other disturbances of skin sensation: Secondary | ICD-10-CM | POA: Diagnosis not present

## 2020-05-10 DIAGNOSIS — L821 Other seborrheic keratosis: Secondary | ICD-10-CM | POA: Diagnosis not present

## 2020-05-10 DIAGNOSIS — D2272 Melanocytic nevi of left lower limb, including hip: Secondary | ICD-10-CM | POA: Diagnosis not present

## 2020-05-10 DIAGNOSIS — D2271 Melanocytic nevi of right lower limb, including hip: Secondary | ICD-10-CM | POA: Diagnosis not present

## 2020-05-10 DIAGNOSIS — L298 Other pruritus: Secondary | ICD-10-CM | POA: Diagnosis not present

## 2020-05-10 DIAGNOSIS — D2262 Melanocytic nevi of left upper limb, including shoulder: Secondary | ICD-10-CM | POA: Diagnosis not present

## 2020-05-10 DIAGNOSIS — D225 Melanocytic nevi of trunk: Secondary | ICD-10-CM | POA: Diagnosis not present

## 2020-05-10 DIAGNOSIS — D2261 Melanocytic nevi of right upper limb, including shoulder: Secondary | ICD-10-CM | POA: Diagnosis not present

## 2020-05-10 DIAGNOSIS — L820 Inflamed seborrheic keratosis: Secondary | ICD-10-CM | POA: Diagnosis not present

## 2020-05-18 DIAGNOSIS — J40 Bronchitis, not specified as acute or chronic: Secondary | ICD-10-CM | POA: Diagnosis not present

## 2020-05-18 DIAGNOSIS — J811 Chronic pulmonary edema: Secondary | ICD-10-CM | POA: Diagnosis not present

## 2020-05-18 DIAGNOSIS — Z20822 Contact with and (suspected) exposure to covid-19: Secondary | ICD-10-CM | POA: Diagnosis not present

## 2020-05-18 DIAGNOSIS — I517 Cardiomegaly: Secondary | ICD-10-CM | POA: Diagnosis not present

## 2020-05-18 DIAGNOSIS — R6889 Other general symptoms and signs: Secondary | ICD-10-CM | POA: Diagnosis not present

## 2020-05-20 DIAGNOSIS — U071 COVID-19: Secondary | ICD-10-CM | POA: Diagnosis not present

## 2020-05-20 DIAGNOSIS — Z7189 Other specified counseling: Secondary | ICD-10-CM | POA: Diagnosis not present

## 2020-05-25 DIAGNOSIS — U071 COVID-19: Secondary | ICD-10-CM | POA: Diagnosis not present

## 2020-05-25 DIAGNOSIS — J208 Acute bronchitis due to other specified organisms: Secondary | ICD-10-CM | POA: Diagnosis not present

## 2020-06-24 DIAGNOSIS — B0223 Postherpetic polyneuropathy: Secondary | ICD-10-CM | POA: Diagnosis not present

## 2020-07-20 DIAGNOSIS — F0390 Unspecified dementia without behavioral disturbance: Secondary | ICD-10-CM | POA: Diagnosis not present

## 2020-07-20 DIAGNOSIS — B0223 Postherpetic polyneuropathy: Secondary | ICD-10-CM | POA: Diagnosis not present

## 2020-08-31 DIAGNOSIS — E039 Hypothyroidism, unspecified: Secondary | ICD-10-CM | POA: Diagnosis not present

## 2020-08-31 DIAGNOSIS — E782 Mixed hyperlipidemia: Secondary | ICD-10-CM | POA: Diagnosis not present

## 2020-09-07 DIAGNOSIS — E538 Deficiency of other specified B group vitamins: Secondary | ICD-10-CM | POA: Diagnosis not present

## 2020-09-07 DIAGNOSIS — B0223 Postherpetic polyneuropathy: Secondary | ICD-10-CM | POA: Diagnosis not present

## 2020-09-07 DIAGNOSIS — Z0000 Encounter for general adult medical examination without abnormal findings: Secondary | ICD-10-CM | POA: Diagnosis not present

## 2020-09-07 DIAGNOSIS — E782 Mixed hyperlipidemia: Secondary | ICD-10-CM | POA: Diagnosis not present

## 2020-09-07 DIAGNOSIS — E039 Hypothyroidism, unspecified: Secondary | ICD-10-CM | POA: Diagnosis not present

## 2020-12-09 DIAGNOSIS — H00012 Hordeolum externum right lower eyelid: Secondary | ICD-10-CM | POA: Diagnosis not present

## 2020-12-09 DIAGNOSIS — H18513 Endothelial corneal dystrophy, bilateral: Secondary | ICD-10-CM | POA: Diagnosis not present

## 2021-02-23 ENCOUNTER — Other Ambulatory Visit: Payer: Self-pay | Admitting: Internal Medicine

## 2021-02-23 DIAGNOSIS — Z1231 Encounter for screening mammogram for malignant neoplasm of breast: Secondary | ICD-10-CM

## 2021-03-08 DIAGNOSIS — E538 Deficiency of other specified B group vitamins: Secondary | ICD-10-CM | POA: Diagnosis not present

## 2021-03-08 DIAGNOSIS — E782 Mixed hyperlipidemia: Secondary | ICD-10-CM | POA: Diagnosis not present

## 2021-03-15 DIAGNOSIS — E782 Mixed hyperlipidemia: Secondary | ICD-10-CM | POA: Diagnosis not present

## 2021-03-15 DIAGNOSIS — E538 Deficiency of other specified B group vitamins: Secondary | ICD-10-CM | POA: Diagnosis not present

## 2021-03-15 DIAGNOSIS — G309 Alzheimer's disease, unspecified: Secondary | ICD-10-CM | POA: Diagnosis not present

## 2021-03-15 DIAGNOSIS — F0670 Mild neurocognitive disorder due to known physiological condition without behavioral disturbance: Secondary | ICD-10-CM | POA: Diagnosis not present

## 2021-03-15 DIAGNOSIS — N1832 Chronic kidney disease, stage 3b: Secondary | ICD-10-CM | POA: Diagnosis not present

## 2021-03-15 DIAGNOSIS — Z0000 Encounter for general adult medical examination without abnormal findings: Secondary | ICD-10-CM | POA: Diagnosis not present

## 2021-03-15 DIAGNOSIS — Z23 Encounter for immunization: Secondary | ICD-10-CM | POA: Diagnosis not present

## 2021-03-29 ENCOUNTER — Other Ambulatory Visit: Payer: Self-pay

## 2021-03-29 ENCOUNTER — Ambulatory Visit
Admission: RE | Admit: 2021-03-29 | Discharge: 2021-03-29 | Disposition: A | Discharge status: Home / Self Care | Payer: PPO | Source: Ambulatory Visit | Attending: Internal Medicine | Admitting: Internal Medicine

## 2021-03-29 DIAGNOSIS — Z1231 Encounter for screening mammogram for malignant neoplasm of breast: Secondary | ICD-10-CM | POA: Insufficient documentation

## 2021-04-19 DIAGNOSIS — S51851A Open bite of right forearm, initial encounter: Secondary | ICD-10-CM | POA: Diagnosis not present

## 2021-04-19 DIAGNOSIS — L03113 Cellulitis of right upper limb: Secondary | ICD-10-CM | POA: Diagnosis not present

## 2021-04-19 DIAGNOSIS — W5501XA Bitten by cat, initial encounter: Secondary | ICD-10-CM | POA: Diagnosis not present

## 2021-04-19 DIAGNOSIS — M189 Osteoarthritis of first carpometacarpal joint, unspecified: Secondary | ICD-10-CM | POA: Diagnosis not present

## 2021-09-06 DIAGNOSIS — E538 Deficiency of other specified B group vitamins: Secondary | ICD-10-CM | POA: Diagnosis not present

## 2021-09-06 DIAGNOSIS — E782 Mixed hyperlipidemia: Secondary | ICD-10-CM | POA: Diagnosis not present

## 2021-09-13 DIAGNOSIS — E538 Deficiency of other specified B group vitamins: Secondary | ICD-10-CM | POA: Diagnosis not present

## 2021-09-13 DIAGNOSIS — Z0000 Encounter for general adult medical examination without abnormal findings: Secondary | ICD-10-CM | POA: Diagnosis not present

## 2021-09-13 DIAGNOSIS — G309 Alzheimer's disease, unspecified: Secondary | ICD-10-CM | POA: Diagnosis not present

## 2021-09-13 DIAGNOSIS — F0670 Mild neurocognitive disorder due to known physiological condition without behavioral disturbance: Secondary | ICD-10-CM | POA: Diagnosis not present

## 2021-09-13 DIAGNOSIS — N1832 Chronic kidney disease, stage 3b: Secondary | ICD-10-CM | POA: Diagnosis not present

## 2021-09-13 DIAGNOSIS — E782 Mixed hyperlipidemia: Secondary | ICD-10-CM | POA: Diagnosis not present

## 2021-09-15 DIAGNOSIS — H353130 Nonexudative age-related macular degeneration, bilateral, stage unspecified: Secondary | ICD-10-CM | POA: Diagnosis not present

## 2021-09-19 DIAGNOSIS — L538 Other specified erythematous conditions: Secondary | ICD-10-CM | POA: Diagnosis not present

## 2021-09-19 DIAGNOSIS — L820 Inflamed seborrheic keratosis: Secondary | ICD-10-CM | POA: Diagnosis not present

## 2021-10-04 DIAGNOSIS — H353211 Exudative age-related macular degeneration, right eye, with active choroidal neovascularization: Secondary | ICD-10-CM | POA: Diagnosis not present

## 2021-10-04 DIAGNOSIS — H353122 Nonexudative age-related macular degeneration, left eye, intermediate dry stage: Secondary | ICD-10-CM | POA: Diagnosis not present

## 2021-10-04 DIAGNOSIS — H26491 Other secondary cataract, right eye: Secondary | ICD-10-CM | POA: Diagnosis not present

## 2021-10-10 DIAGNOSIS — H353211 Exudative age-related macular degeneration, right eye, with active choroidal neovascularization: Secondary | ICD-10-CM | POA: Diagnosis not present

## 2021-10-10 DIAGNOSIS — H18513 Endothelial corneal dystrophy, bilateral: Secondary | ICD-10-CM | POA: Diagnosis not present

## 2021-11-14 DIAGNOSIS — H353211 Exudative age-related macular degeneration, right eye, with active choroidal neovascularization: Secondary | ICD-10-CM | POA: Diagnosis not present

## 2021-12-26 DIAGNOSIS — H353211 Exudative age-related macular degeneration, right eye, with active choroidal neovascularization: Secondary | ICD-10-CM | POA: Diagnosis not present

## 2022-02-08 ENCOUNTER — Other Ambulatory Visit: Payer: Self-pay | Admitting: Internal Medicine

## 2022-02-08 DIAGNOSIS — Z1231 Encounter for screening mammogram for malignant neoplasm of breast: Secondary | ICD-10-CM

## 2022-02-17 DIAGNOSIS — D2262 Melanocytic nevi of left upper limb, including shoulder: Secondary | ICD-10-CM | POA: Diagnosis not present

## 2022-02-17 DIAGNOSIS — D2271 Melanocytic nevi of right lower limb, including hip: Secondary | ICD-10-CM | POA: Diagnosis not present

## 2022-02-17 DIAGNOSIS — D225 Melanocytic nevi of trunk: Secondary | ICD-10-CM | POA: Diagnosis not present

## 2022-02-17 DIAGNOSIS — D2261 Melanocytic nevi of right upper limb, including shoulder: Secondary | ICD-10-CM | POA: Diagnosis not present

## 2022-02-17 DIAGNOSIS — D2272 Melanocytic nevi of left lower limb, including hip: Secondary | ICD-10-CM | POA: Diagnosis not present

## 2022-02-17 DIAGNOSIS — L821 Other seborrheic keratosis: Secondary | ICD-10-CM | POA: Diagnosis not present

## 2022-02-27 DIAGNOSIS — H353211 Exudative age-related macular degeneration, right eye, with active choroidal neovascularization: Secondary | ICD-10-CM | POA: Diagnosis not present

## 2022-03-21 DIAGNOSIS — E782 Mixed hyperlipidemia: Secondary | ICD-10-CM | POA: Diagnosis not present

## 2022-03-21 DIAGNOSIS — E538 Deficiency of other specified B group vitamins: Secondary | ICD-10-CM | POA: Diagnosis not present

## 2022-03-28 DIAGNOSIS — Z0000 Encounter for general adult medical examination without abnormal findings: Secondary | ICD-10-CM | POA: Diagnosis not present

## 2022-03-28 DIAGNOSIS — G309 Alzheimer's disease, unspecified: Secondary | ICD-10-CM | POA: Diagnosis not present

## 2022-03-28 DIAGNOSIS — E538 Deficiency of other specified B group vitamins: Secondary | ICD-10-CM | POA: Diagnosis not present

## 2022-03-28 DIAGNOSIS — M79674 Pain in right toe(s): Secondary | ICD-10-CM | POA: Diagnosis not present

## 2022-03-28 DIAGNOSIS — F0280 Dementia in other diseases classified elsewhere without behavioral disturbance: Secondary | ICD-10-CM | POA: Diagnosis not present

## 2022-03-28 DIAGNOSIS — B351 Tinea unguium: Secondary | ICD-10-CM | POA: Diagnosis not present

## 2022-03-28 DIAGNOSIS — E782 Mixed hyperlipidemia: Secondary | ICD-10-CM | POA: Diagnosis not present

## 2022-03-28 DIAGNOSIS — M79675 Pain in left toe(s): Secondary | ICD-10-CM | POA: Diagnosis not present

## 2022-05-30 DIAGNOSIS — H353211 Exudative age-related macular degeneration, right eye, with active choroidal neovascularization: Secondary | ICD-10-CM | POA: Diagnosis not present

## 2022-05-30 DIAGNOSIS — Z961 Presence of intraocular lens: Secondary | ICD-10-CM | POA: Diagnosis not present

## 2022-05-30 DIAGNOSIS — H353122 Nonexudative age-related macular degeneration, left eye, intermediate dry stage: Secondary | ICD-10-CM | POA: Diagnosis not present

## 2022-05-30 DIAGNOSIS — H18513 Endothelial corneal dystrophy, bilateral: Secondary | ICD-10-CM | POA: Diagnosis not present

## 2022-06-02 ENCOUNTER — Ambulatory Visit
Admission: RE | Admit: 2022-06-02 | Discharge: 2022-06-02 | Disposition: A | Discharge status: Home / Self Care | Payer: PPO | Source: Ambulatory Visit | Attending: Internal Medicine | Admitting: Internal Medicine

## 2022-06-02 DIAGNOSIS — Z1231 Encounter for screening mammogram for malignant neoplasm of breast: Secondary | ICD-10-CM | POA: Diagnosis not present

## 2022-06-16 DIAGNOSIS — J309 Allergic rhinitis, unspecified: Secondary | ICD-10-CM | POA: Diagnosis not present

## 2022-06-16 DIAGNOSIS — G309 Alzheimer's disease, unspecified: Secondary | ICD-10-CM | POA: Diagnosis not present

## 2022-06-16 DIAGNOSIS — H353211 Exudative age-related macular degeneration, right eye, with active choroidal neovascularization: Secondary | ICD-10-CM | POA: Diagnosis not present

## 2022-06-16 DIAGNOSIS — F0390 Unspecified dementia without behavioral disturbance: Secondary | ICD-10-CM | POA: Diagnosis not present

## 2022-06-16 DIAGNOSIS — K219 Gastro-esophageal reflux disease without esophagitis: Secondary | ICD-10-CM | POA: Diagnosis not present

## 2022-06-16 DIAGNOSIS — H9193 Unspecified hearing loss, bilateral: Secondary | ICD-10-CM | POA: Diagnosis not present

## 2022-06-16 DIAGNOSIS — E039 Hypothyroidism, unspecified: Secondary | ICD-10-CM | POA: Diagnosis not present

## 2022-06-16 DIAGNOSIS — E785 Hyperlipidemia, unspecified: Secondary | ICD-10-CM | POA: Diagnosis not present

## 2022-06-16 DIAGNOSIS — G8929 Other chronic pain: Secondary | ICD-10-CM | POA: Diagnosis not present

## 2022-07-10 DIAGNOSIS — H26492 Other secondary cataract, left eye: Secondary | ICD-10-CM | POA: Diagnosis not present

## 2022-07-31 DIAGNOSIS — H353122 Nonexudative age-related macular degeneration, left eye, intermediate dry stage: Secondary | ICD-10-CM | POA: Diagnosis not present

## 2022-07-31 DIAGNOSIS — H353211 Exudative age-related macular degeneration, right eye, with active choroidal neovascularization: Secondary | ICD-10-CM | POA: Diagnosis not present

## 2022-07-31 DIAGNOSIS — H26492 Other secondary cataract, left eye: Secondary | ICD-10-CM | POA: Diagnosis not present

## 2022-09-20 DIAGNOSIS — E538 Deficiency of other specified B group vitamins: Secondary | ICD-10-CM | POA: Diagnosis not present

## 2022-09-20 DIAGNOSIS — E782 Mixed hyperlipidemia: Secondary | ICD-10-CM | POA: Diagnosis not present

## 2022-09-27 DIAGNOSIS — E538 Deficiency of other specified B group vitamins: Secondary | ICD-10-CM | POA: Diagnosis not present

## 2022-09-27 DIAGNOSIS — G309 Alzheimer's disease, unspecified: Secondary | ICD-10-CM | POA: Diagnosis not present

## 2022-09-27 DIAGNOSIS — E782 Mixed hyperlipidemia: Secondary | ICD-10-CM | POA: Diagnosis not present

## 2022-09-27 DIAGNOSIS — Z0000 Encounter for general adult medical examination without abnormal findings: Secondary | ICD-10-CM | POA: Diagnosis not present

## 2022-09-27 DIAGNOSIS — F0280 Dementia in other diseases classified elsewhere without behavioral disturbance: Secondary | ICD-10-CM | POA: Diagnosis not present

## 2022-10-31 DIAGNOSIS — H353211 Exudative age-related macular degeneration, right eye, with active choroidal neovascularization: Secondary | ICD-10-CM | POA: Diagnosis not present

## 2022-10-31 DIAGNOSIS — H353122 Nonexudative age-related macular degeneration, left eye, intermediate dry stage: Secondary | ICD-10-CM | POA: Diagnosis not present

## 2022-11-17 DIAGNOSIS — L309 Dermatitis, unspecified: Secondary | ICD-10-CM | POA: Diagnosis not present

## 2022-11-17 DIAGNOSIS — L821 Other seborrheic keratosis: Secondary | ICD-10-CM | POA: Diagnosis not present

## 2023-01-01 DIAGNOSIS — R21 Rash and other nonspecific skin eruption: Secondary | ICD-10-CM | POA: Diagnosis not present

## 2023-01-01 DIAGNOSIS — L309 Dermatitis, unspecified: Secondary | ICD-10-CM | POA: Diagnosis not present

## 2023-02-06 DIAGNOSIS — H353211 Exudative age-related macular degeneration, right eye, with active choroidal neovascularization: Secondary | ICD-10-CM | POA: Diagnosis not present

## 2023-02-26 DIAGNOSIS — L280 Lichen simplex chronicus: Secondary | ICD-10-CM | POA: Diagnosis not present

## 2023-02-26 DIAGNOSIS — G309 Alzheimer's disease, unspecified: Secondary | ICD-10-CM | POA: Diagnosis not present

## 2023-02-26 DIAGNOSIS — L508 Other urticaria: Secondary | ICD-10-CM | POA: Diagnosis not present

## 2023-02-26 DIAGNOSIS — F0280 Dementia in other diseases classified elsewhere without behavioral disturbance: Secondary | ICD-10-CM | POA: Diagnosis not present

## 2023-03-28 DIAGNOSIS — E782 Mixed hyperlipidemia: Secondary | ICD-10-CM | POA: Diagnosis not present

## 2023-03-28 DIAGNOSIS — E538 Deficiency of other specified B group vitamins: Secondary | ICD-10-CM | POA: Diagnosis not present

## 2023-04-04 DIAGNOSIS — Z0000 Encounter for general adult medical examination without abnormal findings: Secondary | ICD-10-CM | POA: Diagnosis not present

## 2023-04-04 DIAGNOSIS — E538 Deficiency of other specified B group vitamins: Secondary | ICD-10-CM | POA: Diagnosis not present

## 2023-04-04 DIAGNOSIS — E782 Mixed hyperlipidemia: Secondary | ICD-10-CM | POA: Diagnosis not present

## 2023-05-10 ENCOUNTER — Other Ambulatory Visit: Payer: Self-pay | Admitting: Internal Medicine

## 2023-05-10 DIAGNOSIS — Z1231 Encounter for screening mammogram for malignant neoplasm of breast: Secondary | ICD-10-CM

## 2023-06-12 ENCOUNTER — Ambulatory Visit
Admission: RE | Admit: 2023-06-12 | Discharge: 2023-06-12 | Disposition: A | Discharge status: Home / Self Care | Payer: PPO | Source: Ambulatory Visit | Attending: Internal Medicine | Admitting: Internal Medicine

## 2023-06-12 DIAGNOSIS — Z961 Presence of intraocular lens: Secondary | ICD-10-CM | POA: Diagnosis not present

## 2023-06-12 DIAGNOSIS — H353211 Exudative age-related macular degeneration, right eye, with active choroidal neovascularization: Secondary | ICD-10-CM | POA: Diagnosis not present

## 2023-06-12 DIAGNOSIS — Z1231 Encounter for screening mammogram for malignant neoplasm of breast: Secondary | ICD-10-CM | POA: Insufficient documentation

## 2023-06-12 DIAGNOSIS — H353122 Nonexudative age-related macular degeneration, left eye, intermediate dry stage: Secondary | ICD-10-CM | POA: Diagnosis not present

## 2023-06-14 ENCOUNTER — Other Ambulatory Visit: Payer: Self-pay | Admitting: Internal Medicine

## 2023-06-14 DIAGNOSIS — R928 Other abnormal and inconclusive findings on diagnostic imaging of breast: Secondary | ICD-10-CM

## 2023-06-20 ENCOUNTER — Ambulatory Visit
Admission: RE | Admit: 2023-06-20 | Discharge: 2023-06-20 | Disposition: A | Discharge status: Home / Self Care | Payer: PPO | Source: Ambulatory Visit | Attending: Internal Medicine | Admitting: Internal Medicine

## 2023-06-20 DIAGNOSIS — R928 Other abnormal and inconclusive findings on diagnostic imaging of breast: Secondary | ICD-10-CM | POA: Diagnosis not present

## 2023-06-20 DIAGNOSIS — N6321 Unspecified lump in the left breast, upper outer quadrant: Secondary | ICD-10-CM | POA: Diagnosis not present

## 2023-06-20 DIAGNOSIS — R92322 Mammographic fibroglandular density, left breast: Secondary | ICD-10-CM | POA: Diagnosis not present

## 2023-06-20 DIAGNOSIS — N6002 Solitary cyst of left breast: Secondary | ICD-10-CM | POA: Diagnosis not present

## 2023-07-03 ENCOUNTER — Ambulatory Visit: Payer: PPO | Admitting: Dermatology

## 2023-07-03 DIAGNOSIS — L2081 Atopic neurodermatitis: Secondary | ICD-10-CM

## 2023-07-03 MED ORDER — TACROLIMUS 0.1 % EX OINT: TOPICAL_OINTMENT | Freq: Two times a day (BID) | CUTANEOUS | 1 refills | Status: AC

## 2023-07-03 MED ORDER — DUPILUMAB 300 MG/2ML ~~LOC~~ SOSY
300.0000 mg | PREFILLED_SYRINGE | SUBCUTANEOUS | Status: AC
  Administered 2023-07-03 – 2023-07-17 (×2): 300 mg via SUBCUTANEOUS

## 2023-07-03 MED ORDER — DUPIXENT 300 MG/2ML ~~LOC~~ SOAJ: 300.0000 mg | SUBCUTANEOUS | 6 refills | Status: AC

## 2023-07-03 NOTE — Patient Instructions (Addendum)
Gentle Skin Care Guide  1. Bathe no more than once a day.  2. Avoid bathing in hot water  3. Use a mild soap like Dove, Vanicream, Cetaphil, CeraVe. Can use Lever 2000 or Cetaphil antibacterial soap  4. Use soap only where you need it. On most days, use it under your arms, between your legs, and on your feet. Let the water rinse other areas unless visibly dirty.  5. When you get out of the bath/shower, use a towel to gently blot your skin dry, don't rub it.  6. While your skin is still a little damp, apply a moisturizing cream such as Vanicream, CeraVe, Cetaphil, Eucerin, Sarna lotion or plain Vaseline Jelly. For hands apply Neutrogena [place] Hand Cream or Excipial Hand Cream.  7. Reapply moisturizer any time you start to itch or feel dry.  8. Sometimes using free and clear laundry detergents can be helpful. Fabric softener sheets should be avoided. Downy Free & Gentle liquid, or any liquid fabric softener that is free of dyes and perfumes, it acceptable to use  9. If your doctor has given you prescription creams you may apply moisturizers over them      Due to recent changes in healthcare laws, you may see results of your pathology and/or laboratory studies on MyChart before the doctors have had a chance to review them. We understand that in some cases there may be results that are confusing or concerning to you. Please understand that not all results are received at the same time and often the doctors may need to interpret multiple results in order to provide you with the best plan of care or course of treatment. Therefore, we ask that you please give [place] 2 business days to thoroughly review all your results before contacting the office for clarification. Should we see a critical lab result, you will be contacted sooner.   If You Need Anything After Your Visit  If you have any questions or concerns for your doctor, please call our main line at [phone] and press option 4 to reach  your doctor's medical assistant. If no one answers, please leave a voicemail as directed and we will return your call as soon as possible. Messages left after 4 pm will be answered the following business day.   You may also send [place] a message via MyChart. We typically respond to MyChart messages within 1-2 business days.  For prescription refills, please ask your pharmacy to contact our office. Our fax number is [phone].  If you have an urgent issue when the clinic is closed that cannot wait until the next business day, you can page your doctor at the number below.    Please note that while we do our best to be available for urgent issues outside of office hours, we are not available 24/7.   If you have an urgent issue and are unable to reach [place], you may choose to seek medical care at your doctor's office, retail clinic, urgent care center, or emergency room.  If you have a medical emergency, please immediately call 911 or go to the emergency department.  Pager Numbers  - Dr. Gwen Pounds: [phone]  - Dr. Roseanne Reno: [phone]  - Dr. Katrinka Blazing: [phone]   In the event of inclement weather, please call our main line at [phone] for an update on the status of any delays or closures.  Dermatology Medication Tips: Please keep the boxes that topical medications come in in order to help keep track of the instructions  about where and how to use these. Pharmacies typically print the medication instructions only on the boxes and not directly on the medication tubes.   If your medication is too expensive, please contact our office at [phone] option 4 or send [place] a message through MyChart.   We are unable to tell what your co-pay for medications will be in advance as this is different depending on your insurance coverage. However, we may be able to find a substitute medication at lower cost or fill out paperwork to get insurance to cover a needed medication.   If a prior authorization  is required to get your medication covered by your insurance company, please allow [place] 1-2 business days to complete this process.  Drug prices often vary depending on where the prescription is filled and some pharmacies may offer cheaper prices.  The website www.goodrx.com contains coupons for medications through different pharmacies. The prices here do not account for what the cost may be with help from insurance (it may be cheaper with your insurance), but the website can give you the price if you did not use any insurance.  - You can print the associated coupon and take it with your prescription to the pharmacy.  - You may also stop by our office during regular business hours and pick up a GoodRx coupon card.  - If you need your prescription sent electronically to a different pharmacy, notify our office through [facility] or by phone at [phone] option 4.     Si Usted Necesita Algo Despus de Su Visita  Tambin puede enviarnos un mensaje a travs de Clinical cytogeneticist. Por lo general respondemos a los mensajes de MyChart en el transcurso de 1 a 2 das hbiles.  Para renovar recetas, por favor pida a su farmacia que se ponga en contacto con nuestra oficina. Annie Sable de fax es [place] [phone].  Si tiene un asunto urgente cuando la clnica est cerrada y que no puede esperar hasta el siguiente da hbil, puede llamar/localizar a su doctor(a) al nmero que aparece a continuacin.   Por favor, tenga en cuenta que aunque hacemos todo lo posible para estar disponibles para asuntos urgentes fuera del horario de [place], no estamos disponibles las 24 horas del da, los 7 [address]  [address] de la [place].   Si tiene un problema urgente y no puede comunicarse con nosotros, puede optar por buscar atencin mdica  en el consultorio de su doctor(a), en una clnica privada, en un centro de atencin urgente o en una sala de emergencias.  Si tiene Engineer, drilling, por favor llame inmediatamente al 911 o  vaya a la sala de emergencias.  Nmeros de bper  - Dr. Gwen Pounds: [phone]  - Dra. Roseanne Reno: [phone]  - Dr. Katrinka Blazing: [phone]   En caso de inclemencias del tiempo, por favor llame a Lacy Duverney principal al [phone] para una actualizacin sobre el [place] de cualquier retraso o cierre.  Consejos para la medicacin en dermatologa: Por favor, guarde las cajas en las que vienen los medicamentos de uso tpico para ayudarle a seguir las instrucciones sobre dnde y cmo usarlos. Las farmacias generalmente imprimen las instrucciones del medicamento slo en las cajas y no directamente en los tubos del [place].   Si su medicamento es muy caro, por favor, pngase en contacto con Rolm Gala llamando al [phone] y presione la opcin 4 o envenos un mensaje a travs de Clinical cytogeneticist.   No podemos decirle cul ser su copago por los medicamentos por adelantado ya  que esto es diferente dependiendo de la cobertura de su seguro. Sin embargo, es posible que podamos encontrar un medicamento sustituto a Audiological scientist un formulario para que el seguro cubra el medicamento que se considera necesario.   Si se requiere una autorizacin previa para que su compaa de seguros [place] su medicamento, por favor permtanos de 1 a 2 das hbiles para completar [address]  Los precios de los medicamentos varan con frecuencia dependiendo del Environmental consultant de dnde se surte la receta y alguna farmacias pueden ofrecer precios ms baratos.  El sitio web www.goodrx.com tiene cupones para medicamentos de Health and safety inspector. Los precios aqu no tienen en cuenta lo que podra costar con la ayuda del seguro (puede ser ms barato con su seguro), pero el sitio web puede darle el precio si no utiliz Tourist information centre manager.  - Puede imprimir el cupn correspondiente y llevarlo con su receta a la farmacia.  - Tambin puede pasar por nuestra oficina durante el horario de atencin regular y Education officer, museum una tarjeta de cupones  de GoodRx.  - Si necesita que su receta se enve electrnicamente a una farmacia diferente, informe a nuestra oficina a travs de MyChart de [place] o por telfono llamando al [phone] y presione la opcin 4.

## 2023-07-03 NOTE — Progress Notes (Addendum)
   New Patient Visit   Subjective  Pamela Moore is a 89 y.o. female who presents for the following: Dermatitis chest, back, arms 34m, itchy, Vaseline, Dr. Hyacinth Meeker txted with prednisone in past, hx of [facility] cr, hx of seasonal allergies    The following portions of the chart were reviewed this encounter and updated as appropriate: medications, allergies, medical history  Review of Systems:  No other skin or systemic complaints except as noted in HPI or Assessment and Plan.  Objective  Well appearing patient in no apparent distress; mood and affect are within normal limits.   A focused examination was performed of the following areas: Chest, arms, back  Relevant exam findings are noted in the Assessment and Plan.    Assessment & Plan   ATOPIC NEURODERMATITIS Arms, chest, upper back Exam: multiple pink excoriated paps arms, chest, upper back, itching all over upper body 6% BSA lesional skin, 12% BSA non-lesional skin  Chronic and persistent condition with duration or expected duration over one year. Condition is bothersome/symptomatic for patient. Currently flared.   Atopic dermatitis (eczema) is a chronic, relapsing, pruritic condition that can significantly affect quality of life. It is often associated with allergic rhinitis and/or asthma and can require treatment with topical medications, phototherapy, or in severe cases biologic injectable medication (Dupixent; Adbry) or Oral JAK inhibitors.  Treatment Plan: Start Tacrolimus 0.1% oint qd/bid to itchy rash on body Start Dupixent 300mg /5ml sq injections Dupixent 300mg /22ml sq injections x 2 today to R and L upper arms samples x  2 Lot [id number] exp 12/2024  Dupilumab (Dupixent) is a treatment given by injection for adults and children with moderate-to-severe atopic dermatitis. Goal is control of skin condition, not cure. It is given as 2 injections at the first dose followed by 1 injection ever 2 weeks thereafter.  Young children are  dosed monthly.  Potential side effects include allergic reaction, herpes infections, injection site reactions and conjunctivitis (inflammation of the eyes).  The use of Dupixent requires long term medication management, including periodic office visits.  Recommend gentle skin care.   ATOPIC NEURODERMATITIS   Related Medications dupilumab (DUPIXENT) prefilled syringe 300 mg   Return in about 2 weeks (around 07/17/2023) for nurse for Dupixent, 1 month with Dr. Roseanne Reno Atopic Derm f/u.  I, Pamela Moore, RMA, am acting as scribe for Pamela Niece, MD .   Documentation: I have reviewed the above documentation for accuracy and completeness, and I agree with the above.  Pamela Niece, MD

## 2023-07-12 ENCOUNTER — Telehealth: Payer: Self-pay

## 2023-07-12 NOTE — Telephone Encounter (Signed)
 Dupixent PA denied. Patient must have at least 10% BSA AND index value of 25 for atopic dermatitis.

## 2023-07-16 NOTE — Telephone Encounter (Signed)
 Thank you. I will ask Michelene Heady to hold at this time. aw

## 2023-07-17 ENCOUNTER — Ambulatory Visit (INDEPENDENT_AMBULATORY_CARE_PROVIDER_SITE_OTHER): Payer: PPO

## 2023-07-17 DIAGNOSIS — L209 Atopic dermatitis, unspecified: Secondary | ICD-10-CM

## 2023-07-17 NOTE — Progress Notes (Signed)
 Patient here for two week Dupixent injection for severe atopic dermatitis.   Dupixent 300mg  syringe injected into left upper arm. Patient tolerated well.  LOT: [id number] EXP: 08/2025  Dorathy Daft, RMA

## 2023-07-17 NOTE — Telephone Encounter (Signed)
 Spoke with Michelene Heady, they will require the other scoring for insurance and approval purposes.   She is scheduled to see you next for follow up and we can hold appeal until then. She received injection today during nurse visit. aw

## 2023-07-30 ENCOUNTER — Other Ambulatory Visit (HOSPITAL_COMMUNITY): Payer: Self-pay

## 2023-07-31 DIAGNOSIS — H353211 Exudative age-related macular degeneration, right eye, with active choroidal neovascularization: Secondary | ICD-10-CM | POA: Diagnosis not present

## 2023-08-01 ENCOUNTER — Emergency Department

## 2023-08-01 ENCOUNTER — Emergency Department
Admission: EM | Admit: 2023-08-01 | Discharge: 2023-08-01 | Disposition: A | Discharge status: Home / Self Care | Attending: Emergency Medicine | Admitting: Emergency Medicine

## 2023-08-01 ENCOUNTER — Other Ambulatory Visit: Payer: Self-pay

## 2023-08-01 ENCOUNTER — Encounter: Payer: Self-pay | Admitting: Emergency Medicine

## 2023-08-01 DIAGNOSIS — W01198A Fall on same level from slipping, tripping and stumbling with subsequent striking against other object, initial encounter: Secondary | ICD-10-CM | POA: Diagnosis not present

## 2023-08-01 DIAGNOSIS — S199XXA Unspecified injury of neck, initial encounter: Secondary | ICD-10-CM | POA: Diagnosis not present

## 2023-08-01 DIAGNOSIS — S52502A Unspecified fracture of the lower end of left radius, initial encounter for closed fracture: Secondary | ICD-10-CM | POA: Diagnosis not present

## 2023-08-01 DIAGNOSIS — M8580 Other specified disorders of bone density and structure, unspecified site: Secondary | ICD-10-CM | POA: Diagnosis not present

## 2023-08-01 DIAGNOSIS — I6782 Cerebral ischemia: Secondary | ICD-10-CM | POA: Diagnosis not present

## 2023-08-01 DIAGNOSIS — S0101XA Laceration without foreign body of scalp, initial encounter: Secondary | ICD-10-CM | POA: Diagnosis not present

## 2023-08-01 DIAGNOSIS — S0990XA Unspecified injury of head, initial encounter: Secondary | ICD-10-CM | POA: Diagnosis not present

## 2023-08-01 DIAGNOSIS — E039 Hypothyroidism, unspecified: Secondary | ICD-10-CM | POA: Diagnosis not present

## 2023-08-01 DIAGNOSIS — I6523 Occlusion and stenosis of bilateral carotid arteries: Secondary | ICD-10-CM | POA: Diagnosis not present

## 2023-08-01 DIAGNOSIS — S52122A Displaced fracture of head of left radius, initial encounter for closed fracture: Secondary | ICD-10-CM | POA: Diagnosis not present

## 2023-08-01 DIAGNOSIS — S52352A Displaced comminuted fracture of shaft of radius, left arm, initial encounter for closed fracture: Secondary | ICD-10-CM | POA: Diagnosis not present

## 2023-08-01 DIAGNOSIS — R000 Tachycardia, unspecified: Secondary | ICD-10-CM | POA: Diagnosis not present

## 2023-08-01 DIAGNOSIS — S6992XA Unspecified injury of left wrist, hand and finger(s), initial encounter: Secondary | ICD-10-CM | POA: Diagnosis present

## 2023-08-01 DIAGNOSIS — R9431 Abnormal electrocardiogram [ECG] [EKG]: Secondary | ICD-10-CM | POA: Diagnosis not present

## 2023-08-01 DIAGNOSIS — M5032 Other cervical disc degeneration, mid-cervical region, unspecified level: Secondary | ICD-10-CM | POA: Diagnosis not present

## 2023-08-01 DIAGNOSIS — W19XXXA Unspecified fall, initial encounter: Secondary | ICD-10-CM | POA: Diagnosis not present

## 2023-08-01 DIAGNOSIS — M19032 Primary osteoarthritis, left wrist: Secondary | ICD-10-CM | POA: Diagnosis not present

## 2023-08-01 DIAGNOSIS — R58 Hemorrhage, not elsewhere classified: Secondary | ICD-10-CM | POA: Diagnosis not present

## 2023-08-01 DIAGNOSIS — I10 Essential (primary) hypertension: Secondary | ICD-10-CM | POA: Diagnosis not present

## 2023-08-01 DIAGNOSIS — M47812 Spondylosis without myelopathy or radiculopathy, cervical region: Secondary | ICD-10-CM | POA: Diagnosis not present

## 2023-08-01 LAB — COMPREHENSIVE METABOLIC PANEL
ALT: 14 U/L (ref 0–44)
AST: 25 U/L (ref 15–41)
Albumin: 3.7 g/dL (ref 3.5–5.0)
Alkaline Phosphatase: 81 U/L (ref 38–126)
Anion gap: 10 (ref 5–15)
BUN: 20 mg/dL (ref 8–23)
CO2: 27 mmol/L (ref 22–32)
Calcium: 9.4 mg/dL (ref 8.9–10.3)
Chloride: 99 mmol/L (ref 98–111)
Creatinine, Ser: 0.95 mg/dL (ref 0.44–1.00)
GFR, Estimated: 56 mL/min — ABNORMAL LOW (ref >60)
Glucose, Bld: 142 mg/dL — ABNORMAL HIGH (ref 70–99)
Potassium: 3.1 mmol/L — ABNORMAL LOW (ref 3.5–5.1)
Sodium: 136 mmol/L (ref 135–145)
Total Bilirubin: 0.9 mg/dL (ref 0.0–1.2)
Total Protein: 6.6 g/dL (ref 6.5–8.1)

## 2023-08-01 LAB — CBC WITH DIFFERENTIAL/PLATELET
Abs Immature Granulocytes: 0.01 10*3/uL (ref 0.00–0.07)
Basophils Absolute: 0.1 10*3/uL (ref 0.0–0.1)
Basophils Relative: 1 %
Eosinophils Absolute: 0.1 10*3/uL (ref 0.0–0.5)
Eosinophils Relative: 1 %
HCT: 39.7 % (ref 36.0–46.0)
Hemoglobin: 12.6 g/dL (ref 12.0–15.0)
Immature Granulocytes: 0 %
Lymphocytes Relative: 13 %
Lymphs Abs: 1 10*3/uL (ref 0.7–4.0)
MCH: 27.3 pg (ref 26.0–34.0)
MCHC: 31.7 g/dL (ref 30.0–36.0)
MCV: 85.9 fL (ref 80.0–100.0)
Monocytes Absolute: 0.8 10*3/uL (ref 0.1–1.0)
Monocytes Relative: 11 %
Neutro Abs: 5.4 10*3/uL (ref 1.7–7.7)
Neutrophils Relative %: 74 %
Platelets: 214 10*3/uL (ref 150–400)
RBC: 4.62 MIL/uL (ref 3.87–5.11)
RDW: 15.1 % (ref 11.5–15.5)
WBC: 7.3 10*3/uL (ref 4.0–10.5)
nRBC: 0 % (ref 0.0–0.2)

## 2023-08-01 LAB — TROPONIN I (HIGH SENSITIVITY)
Troponin I (High Sensitivity): 18 ng/L — ABNORMAL HIGH (ref <18)
Troponin I (High Sensitivity): 14 ng/L (ref <18)

## 2023-08-01 MED ORDER — LIDOCAINE HCL (PF) 1 % IJ SOLN
30.0000 mL | Freq: Once | INTRAMUSCULAR | Status: DC
  Filled 2023-08-01: qty 30

## 2023-08-01 MED ORDER — ONDANSETRON HCL 4 MG/2ML IJ SOLN
4.0000 mg | Freq: Once | INTRAMUSCULAR | Status: AC
  Administered 2023-08-01: 4 mg via INTRAVENOUS
  Filled 2023-08-01: qty 2

## 2023-08-01 MED ORDER — LIDOCAINE HCL (PF) 1 % IJ SOLN
20.0000 mL | Freq: Once | INTRAMUSCULAR | Status: AC
  Administered 2023-08-01: 20 mL via INTRADERMAL
  Filled 2023-08-01: qty 20

## 2023-08-01 MED ORDER — FENTANYL CITRATE PF 50 MCG/ML IJ SOSY
50.0000 ug | PREFILLED_SYRINGE | Freq: Once | INTRAMUSCULAR | Status: AC
  Administered 2023-08-01: 50 ug via INTRAVENOUS
  Filled 2023-08-01: qty 1

## 2023-08-01 MED ORDER — SODIUM CHLORIDE 0.9 % IV BOLUS
1000.0000 mL | Freq: Once | INTRAVENOUS | Status: AC
  Administered 2023-08-01: 1000 mL via INTRAVENOUS

## 2023-08-01 MED ORDER — OXYCODONE-ACETAMINOPHEN 5-325 MG PO TABS: 0.5000 | ORAL_TABLET | ORAL | 0 refills | Status: AC | PRN

## 2023-08-01 NOTE — ED Notes (Signed)
 This tech and Neurosurgeon assisted in cleaning patient up after stool incontinence. Pt was placed in a hospital gown with new brief and chux pad. Patient was provided with warm blankets and is now resting comfortably with no other needs.

## 2023-08-01 NOTE — ED Provider Notes (Signed)
 [facility] Provider Note   Event Date/Time   First MD Initiated Contact with Patient 08/01/23 [phone]     (approximate) History  Fall  HPI Pamela Moore is a 89 y.o. female with past medical history of hypothyroidism, GERD, hyperlipidemia, and anxiety who presents via EMS after a fall at home.  Patient states that when she stood up to take her medications she had a fall hitting her head on the cabinets with a laceration noted to the left aspect of the left parietal region as well as a deformity to the left wrist.  Patient denies any blood thinners.  Patient states that she believes she was tripped up by her shoes. ROS: Patient currently denies any vision changes, tinnitus, difficulty speaking, facial droop, sore throat, chest pain, shortness of breath, abdominal pain, nausea/vomiting/diarrhea, dysuria, or weakness/numbness/paresthesias in any extremity   Physical Exam  Triage Vital Signs: ED Triage Vitals  Encounter Vitals Group     BP 08/01/23 0935 127/71     Systolic BP Percentile --      Diastolic BP Percentile --      Pulse Rate 08/01/23 0935 (!) 104     Resp 08/01/23 0935 18     Temp 08/01/23 0935 97.6 F (36.4 C)     Temp Source 08/01/23 0935 Oral     SpO2 08/01/23 0935 99 %     Weight 08/01/23 0933 154 lb 5.2 oz (70 kg)     Height 08/01/23 0933 5\' 1"  (1.549 m)     Head Circumference --      Peak Flow --      Pain Score 08/01/23 0933 10     Pain Loc --      Pain Education --      Exclude from Growth Chart --    Most recent vital signs: Vitals:   08/01/23 1300 08/01/23 1330  BP: (!) 120/94 114/75  Pulse: 95 100  Resp: 15 16  Temp:    SpO2: 98% 97%   General: Awake, oriented x4. CV:  Good peripheral perfusion.  Resp:  Normal effort.  Abd:  No distention.  Other:  Elderly overweight Caucasian female resting comfortably in no acute distress.  Obvious deformity to the left wrist with significant tenderness to palpation or active/passive range  of motion ED Results / Procedures / Treatments  Labs (all labs ordered are listed, but only abnormal results are displayed) Labs Reviewed  COMPREHENSIVE METABOLIC PANEL - Abnormal; Notable for the following components:      Result Value   Potassium 3.1 (*)    Glucose, Bld 142 (*)    GFR, Estimated 56 (*)    All other components within normal limits  TROPONIN I (HIGH SENSITIVITY) - Abnormal; Notable for the following components:   Troponin I (High Sensitivity) 18 (*)    All other components within normal limits  CBC WITH DIFFERENTIAL/PLATELET  TROPONIN I (HIGH SENSITIVITY)   EKG ED ECG REPORT I, Merwyn Katos, the attending physician, personally viewed and interpreted this ECG. Date: 08/01/2023 EKG Time: 1042 Rate: 97 Rhythm: normal sinus rhythm QRS Axis: normal Intervals: Bifascicular block ST/T Wave abnormalities: normal Narrative Interpretation: Normal sinus rhythm with bifascicular block.  No evidence of acute ischemia RADIOLOGY ED MD interpretation: CT of the head without contrast interpreted by me shows no evidence of acute abnormalities including no intracerebral hemorrhage, obvious masses, or significant edema CT of the cervical spine interpreted by me does not show any evidence of acute  abnormalities including no acute fracture, malalignment, height loss, or dislocation -Agree with radiology assessment Official radiology report(s): DG Wrist Complete Left Result Date: 08/01/2023 CLINICAL DATA:  Fall and trauma to the left wrist. EXAM: LEFT WRIST - COMPLETE 3+ VIEW COMPARISON:  None Available. FINDINGS: Acute comminuted appearing fracture of the distal radial metaphysis with dorsal angulation of the distal fracture fragment. No dislocation. The bones are osteopenic. There is degenerative changes of the base of the thumb. The soft tissue swelling of the wrist. IMPRESSION: Dorsally angulated fracture of the distal radius. Electronically Signed   By: Elgie Collard M.D.   On:  08/01/2023 12:13   CT Cervical Spine Wo Contrast Result Date: 08/01/2023 CLINICAL DATA:  Neck trauma (Age >= 65y). EXAM: CT CERVICAL SPINE WITHOUT CONTRAST TECHNIQUE: Multidetector CT imaging of the cervical spine was performed without intravenous contrast. Multiplanar CT image reconstructions were also generated. RADIATION DOSE REDUCTION: This exam was performed according to the departmental dose-optimization program which includes automated exposure control, adjustment of the mA and/or kV according to patient size and/or use of iterative reconstruction technique. COMPARISON:  None Available. FINDINGS: Alignment: Trace anterolisthesis C6 on C7, C7 on T1, T1 on T2, and T2 on T3. Skull base and vertebrae: No acute fracture or suspicious lesion. Soft tissues and spinal canal: No prevertebral fluid or swelling. No visible canal hematoma. Disc levels: Widespread facet arthrosis, particularly severe on the left at C4-5. Moderate lower cervical disc degeneration. Upper chest: The included lung apices are clear. Other: Moderate atherosclerotic calcification at the carotid bifurcations. IMPRESSION: No acute cervical spine fracture. Electronically Signed   By: Sebastian Ache M.D.   On: 08/01/2023 11:16   CT Head Wo Contrast Result Date: 08/01/2023 CLINICAL DATA:  Minor head trauma EXAM: CT HEAD WITHOUT CONTRAST TECHNIQUE: Contiguous axial images were obtained from the base of the skull through the vertex without intravenous contrast. RADIATION DOSE REDUCTION: This exam was performed according to the departmental dose-optimization program which includes automated exposure control, adjustment of the mA and/or kV according to patient size and/or use of iterative reconstruction technique. COMPARISON:  12/07/13 FINDINGS: Brain: No evidence of acute infarction, hemorrhage, hydrocephalus, extra-axial collection or mass lesion/mass effect. A nodular high-density in the anterior interhemispheric fissure is chronic when compared to  prior and attributed to calcification. Chronic small vessel ischemia in the periventricular white matter to a moderate degree. Cerebral volume loss since prior which is generalized. Vascular: No hyperdense vessel or unexpected calcification. Skull: No acute fracture Sinuses/Orbits: No evidence of injury IMPRESSION: No acute intracranial finding. Electronically Signed   By: Tiburcio Pea M.D.   On: 08/01/2023 10:47   PROCEDURES: Critical Care performed: No .Ortho Injury Treatment  Date/Time: 08/02/2023 8:41 AM  Performed by: Merwyn Katos, MD Authorized by: Merwyn Katos, MD   Consent:    Consent obtained:  Written   Risks discussed:  Fracture, nerve damage, restricted joint movement, vascular damage and stiffness   Alternatives discussed:  No treatment, alternative treatment, immobilization, referral and delayed treatmentInjury location: wrist Location details: left wrist Injury type: fracture Fracture type: distal radius Pre-procedure distal perfusion: normal Pre-procedure neurological function: normal Pre-procedure range of motion: reduced Anesthesia: hematoma block  Anesthesia: Local anesthesia used: yes Local Anesthetic: lidocaine 1% without epinephrine  Patient sedated: NoManipulation performed: yes Skeletal traction used: yes Reduction successful: yes Immobilization: splint Splint type: sugar tong Splint Applied by: ED Provider Post-procedure neurovascular assessment: post-procedure neurovascularly intact Post-procedure distal perfusion: normal Post-procedure neurological function: normal Post-procedure range of motion:  normal    MEDICATIONS ORDERED IN ED: Medications  sodium chloride 0.9 % bolus 1,000 mL (0 mLs Intravenous Stopped 08/01/23 1310)  fentaNYL (SUBLIMAZE) injection 50 mcg (50 mcg Intravenous Given 08/01/23 1043)  ondansetron (ZOFRAN) injection 4 mg (4 mg Intravenous Given 08/01/23 1043)  lidocaine (PF) (XYLOCAINE) 1 % injection 20 mL (20 mLs  Intradermal Given 08/01/23 1122)   IMPRESSION / MDM / ASSESSMENT AND PLAN / ED COURSE  I reviewed the triage vital signs and the nursing notes.                             The patient is on the cardiac monitor to evaluate for evidence of arrhythmia and/or significant heart rate changes. Patient's presentation is most consistent with acute presentation with potential threat to life or bodily function. The Pt was found to have a closed left radial head fracture on XR. The Pt is otherwise well appearing, hemodynamically stable, and shows no evidence of neurovascular injury or compartment syndrome.  I have low suspicion for dislocation, significant ligamentous injury, septic arthritis, gout flare, new autoimmune arthropathy, or gonococcal arthropathy. Patient was placed in left sugar-tong and will follow up with ortho.    Rx: norco  Dispo: Discharge home with orthopedic follow-up   FINAL CLINICAL IMPRESSION(S) / ED DIAGNOSES   Final diagnoses:  Fall, initial encounter  Laceration of scalp, initial encounter  Closed displaced fracture of head of left radius, initial encounter   Rx / DC Orders   ED Discharge Orders          Ordered    oxyCODONE-acetaminophen (PERCOCET) 5-325 MG tablet  Every 4 hours PRN        08/01/23 1259           Note:  This document was prepared using Dragon voice recognition software and may include unintentional dictation errors.   Merwyn Katos, MD 08/02/23 [phone]

## 2023-08-01 NOTE — ED Notes (Signed)
 Pt placed in chinese finger traps for fx reduction. Pt on monitor at this time. Call bell placed in patient's hand.

## 2023-08-01 NOTE — ED Triage Notes (Signed)
 Patient to ED via [facility] from home after a fall. PT was standing up taking her meds when she fell and hit her head on cabinet. Laceration noted to left side of head. Has deformity to left wrist. Denies LOC or blood thinners. Pt noted to possibly have fallen due to her shoes.   135/74 96% RA 100 HR

## 2023-08-01 NOTE — ED Notes (Signed)
 Called friend for a ride. ETA 10-15 minutes.

## 2023-08-01 NOTE — ED Notes (Signed)
 Pt verbalizes understanding of discharge instructions. Opportunity for questioning and answers were provided. Pt discharged from ED home with friend. Extended amount of time spent explaining discharge instructions to pt and friend.

## 2023-08-01 NOTE — ED Notes (Signed)
 MD at bedside.

## 2023-08-03 DIAGNOSIS — L508 Other urticaria: Secondary | ICD-10-CM | POA: Diagnosis not present

## 2023-08-03 DIAGNOSIS — F0280 Dementia in other diseases classified elsewhere without behavioral disturbance: Secondary | ICD-10-CM | POA: Diagnosis not present

## 2023-08-03 DIAGNOSIS — G309 Alzheimer's disease, unspecified: Secondary | ICD-10-CM | POA: Diagnosis not present

## 2023-08-03 DIAGNOSIS — Z09 Encounter for follow-up examination after completed treatment for conditions other than malignant neoplasm: Secondary | ICD-10-CM | POA: Diagnosis not present

## 2023-08-03 DIAGNOSIS — S52502A Unspecified fracture of the lower end of left radius, initial encounter for closed fracture: Secondary | ICD-10-CM | POA: Diagnosis not present

## 2023-08-03 DIAGNOSIS — S0990XS Unspecified injury of head, sequela: Secondary | ICD-10-CM | POA: Diagnosis not present

## 2023-08-03 DIAGNOSIS — S52602A Unspecified fracture of lower end of left ulna, initial encounter for closed fracture: Secondary | ICD-10-CM | POA: Diagnosis not present

## 2023-08-03 DIAGNOSIS — W010XXD Fall on same level from slipping, tripping and stumbling without subsequent striking against object, subsequent encounter: Secondary | ICD-10-CM | POA: Diagnosis not present

## 2023-08-03 DIAGNOSIS — S52502D Unspecified fracture of the lower end of left radius, subsequent encounter for closed fracture with routine healing: Secondary | ICD-10-CM | POA: Diagnosis not present

## 2023-08-03 DIAGNOSIS — I10 Essential (primary) hypertension: Secondary | ICD-10-CM | POA: Diagnosis not present

## 2023-08-03 DIAGNOSIS — Y92009 Unspecified place in unspecified non-institutional (private) residence as the place of occurrence of the external cause: Secondary | ICD-10-CM | POA: Diagnosis not present

## 2023-08-03 DIAGNOSIS — S0181XD Laceration without foreign body of other part of head, subsequent encounter: Secondary | ICD-10-CM | POA: Diagnosis not present

## 2023-08-07 ENCOUNTER — Encounter: Payer: Self-pay | Admitting: Dermatology

## 2023-08-07 ENCOUNTER — Ambulatory Visit: Payer: PPO | Admitting: Dermatology

## 2023-08-07 DIAGNOSIS — L2081 Atopic neurodermatitis: Secondary | ICD-10-CM

## 2023-08-07 DIAGNOSIS — Z7189 Other specified counseling: Secondary | ICD-10-CM

## 2023-08-07 DIAGNOSIS — Z79899 Other long term (current) drug therapy: Secondary | ICD-10-CM

## 2023-08-07 MED ORDER — DUPILUMAB 300 MG/2ML ~~LOC~~ SOSY
300.0000 mg | PREFILLED_SYRINGE | Freq: Once | SUBCUTANEOUS | Status: AC
  Administered 2023-08-07: 300 mg via SUBCUTANEOUS

## 2023-08-07 MED ORDER — DUPILUMAB 300 MG/2ML ~~LOC~~ SOSY
300.0000 mg | PREFILLED_SYRINGE | SUBCUTANEOUS | Status: AC
  Administered 2023-08-21 – 2023-12-11 (×8): 300 mg via SUBCUTANEOUS

## 2023-08-07 NOTE — Patient Instructions (Signed)
 Continue Dupixent injections every 2 weeks.   Dupilumab (Dupixent) is a treatment given by injection for adults and children with moderate-to-severe atopic dermatitis. Goal is control of skin condition, not cure. It is given as 2 injections at the first dose followed by 1 injection ever 2 weeks thereafter.  Young children are dosed monthly.  Potential side effects include allergic reaction, herpes infections, injection site reactions and conjunctivitis (inflammation of the eyes).  The use of Dupixent requires long term medication management, including periodic office visits.     Gentle Skin Care Guide  1. Bathe no more than once a day.  2. Avoid bathing in hot water  3. Use a mild soap like Dove, Vanicream, Cetaphil, CeraVe. Can use Lever 2000 or Cetaphil antibacterial soap  4. Use soap only where you need it. On most days, use it under your arms, between your legs, and on your feet. Let the water rinse other areas unless visibly dirty.  5. When you get out of the bath/shower, use a towel to gently blot your skin dry, don't rub it.  6. While your skin is still a little damp, apply a moisturizing cream such as Vanicream, CeraVe, Cetaphil, Eucerin, Sarna lotion or plain Vaseline Jelly. For hands apply Neutrogena [place] Hand Cream or Excipial Hand Cream.  7. Reapply moisturizer any time you start to itch or feel dry.  8. Sometimes using free and clear laundry detergents can be helpful. Fabric softener sheets should be avoided. Downy Free & Gentle liquid, or any liquid fabric softener that is free of dyes and perfumes, it acceptable to use  9. If your doctor has given you prescription creams you may apply moisturizers over them       Due to recent changes in healthcare laws, you may see results of your pathology and/or laboratory studies on MyChart before the doctors have had a chance to review them. We understand that in some cases there may be results that are confusing or concerning  to you. Please understand that not all results are received at the same time and often the doctors may need to interpret multiple results in order to provide you with the best plan of care or course of treatment. Therefore, we ask that you please give [place] 2 business days to thoroughly review all your results before contacting the office for clarification. Should we see a critical lab result, you will be contacted sooner.   If You Need Anything After Your Visit  If you have any questions or concerns for your doctor, please call our main line at [phone] and press option 4 to reach your doctor's medical assistant. If no one answers, please leave a voicemail as directed and we will return your call as soon as possible. Messages left after 4 pm will be answered the following business day.   You may also send [place] a message via MyChart. We typically respond to MyChart messages within 1-2 business days.  For prescription refills, please ask your pharmacy to contact our office. Our fax number is [phone].  If you have an urgent issue when the clinic is closed that cannot wait until the next business day, you can page your doctor at the number below.    Please note that while we do our best to be available for urgent issues outside of office hours, we are not available 24/7.   If you have an urgent issue and are unable to reach [place], you may choose to seek medical care at your  doctor's office, retail clinic, urgent care center, or emergency room.  If you have a medical emergency, please immediately call 911 or go to the emergency department.  Pager Numbers  - Dr. Gwen Pounds: [phone]  - Dr. Roseanne Reno: [phone]  - Dr. Katrinka Blazing: [phone]   In the event of inclement weather, please call our main line at [phone] for an update on the status of any delays or closures.  Dermatology Medication Tips: Please keep the boxes that topical medications come in in order to help keep track of the  instructions about where and how to use these. Pharmacies typically print the medication instructions only on the boxes and not directly on the medication tubes.   If your medication is too expensive, please contact our office at [phone] option 4 or send [place] a message through MyChart.   We are unable to tell what your co-pay for medications will be in advance as this is different depending on your insurance coverage. However, we may be able to find a substitute medication at lower cost or fill out paperwork to get insurance to cover a needed medication.   If a prior authorization is required to get your medication covered by your insurance company, please allow [place] 1-2 business days to complete this process.  Drug prices often vary depending on where the prescription is filled and some pharmacies may offer cheaper prices.  The website www.goodrx.com contains coupons for medications through different pharmacies. The prices here do not account for what the cost may be with help from insurance (it may be cheaper with your insurance), but the website can give you the price if you did not use any insurance.  - You can print the associated coupon and take it with your prescription to the pharmacy.  - You may also stop by our office during regular business hours and pick up a GoodRx coupon card.  - If you need your prescription sent electronically to a different pharmacy, notify our office through [facility] or by phone at [phone] option 4.     Si Usted Necesita Algo Despus de Su Visita  Tambin puede enviarnos un mensaje a travs de Clinical cytogeneticist. Por lo general respondemos a los mensajes de MyChart en el transcurso de 1 a 2 das hbiles.  Para renovar recetas, por favor pida a su farmacia que se ponga en contacto con nuestra oficina. Annie Sable de fax es [place] [phone].  Si tiene un asunto urgente cuando la clnica est cerrada y que no puede esperar hasta el siguiente da hbil,  puede llamar/localizar a su doctor(a) al nmero que aparece a continuacin.   Por favor, tenga en cuenta que aunque hacemos todo lo posible para estar disponibles para asuntos urgentes fuera del horario de [place], no estamos disponibles las 24 horas del da, los 7 [address]  [address] de la [place].   Si tiene un problema urgente y no puede comunicarse con nosotros, puede optar por buscar atencin mdica  en el consultorio de su doctor(a), en una clnica privada, en un centro de atencin urgente o en una sala de emergencias.  Si tiene Engineer, drilling, por favor llame inmediatamente al 911 o vaya a la sala de emergencias.  Nmeros de bper  - Dr. Gwen Pounds: [phone]  - Dra. Roseanne Reno: [phone]  - Dr. Katrinka Blazing: [phone]   En caso de inclemencias del tiempo, por favor llame a Lacy Duverney principal al [phone] para una actualizacin sobre el [place] de cualquier retraso o cierre.  Consejos para la medicacin en  dermatologa: Por favor, guarde las cajas en las que vienen los medicamentos de uso tpico para ayudarle a seguir las instrucciones sobre dnde y cmo usarlos. Las farmacias generalmente imprimen las instrucciones del medicamento slo en las cajas y no directamente en los tubos del [place].   Si su medicamento es muy caro, por favor, pngase en contacto con Rolm Gala llamando al [phone] y presione la opcin 4 o envenos un mensaje a travs de Clinical cytogeneticist.   No podemos decirle cul ser su copago por los medicamentos por adelantado ya que esto es diferente dependiendo de la cobertura de su seguro. Sin embargo, es posible que podamos encontrar un medicamento sustituto a Audiological scientist un formulario para que el seguro cubra el medicamento que se considera necesario.   Si se requiere una autorizacin previa para que su compaa de seguros [place] su medicamento, por favor permtanos de 1 a 2 das hbiles para completar [address]  Los precios de los medicamentos varan  con frecuencia dependiendo del Environmental consultant de dnde se surte la receta y alguna farmacias pueden ofrecer precios ms baratos.  El sitio web www.goodrx.com tiene cupones para medicamentos de Health and safety inspector. Los precios aqu no tienen en cuenta lo que podra costar con la ayuda del seguro (puede ser ms barato con su seguro), pero el sitio web puede darle el precio si no utiliz Tourist information centre manager.  - Puede imprimir el cupn correspondiente y llevarlo con su receta a la farmacia.  - Tambin puede pasar por nuestra oficina durante el horario de atencin regular y Education officer, museum una tarjeta de cupones de GoodRx.  - Si necesita que su receta se enve electrnicamente a una farmacia diferente, informe a nuestra oficina a travs de MyChart de [place] o por telfono llamando al [phone] y presione la opcin 4.

## 2023-08-07 NOTE — Progress Notes (Signed)
   Follow-Up Visit   Subjective  Pamela Moore is a 89 y.o. female who presents for the following: Atopic neurodermatitis. 5 week follow up. Arms, chest, upper back. Using CeraVe lotion as directed. Patient states the rash has improved. She is no longer itching like she was. Started Dupixent 07/03/2023. Is 1 week late getting next injection due to fall. Today will be 3rd injection.  Patient states she fell 1 week ago. Was seen at ED. Fractured left arm at distal radius and ulna. States she was taking a medication that caused her BP to drop which caused her to fall.   The following portions of the chart were reviewed this encounter and updated as appropriate: medications, allergies, medical history  Review of Systems:  No other skin or systemic complaints except as noted in HPI or Assessment and Plan.  Objective  Well appearing patient in no apparent distress; mood and affect are within normal limits.  Areas Examined: Face, right arm, left upper arm, chest, back  Relevant physical exam findings are noted in the Assessment and Plan.    Assessment & Plan   ATOPIC NEURODERMATITIS   Related Medications dupilumab (DUPIXENT) prefilled syringe 300 mg  dupilumab (DUPIXENT) prefilled syringe 300 mg    ATOPIC NEURODERMATITIS Exam: hyperpigmented scaly patches at upper back, arms, with healing excoriations; pink scaly papules on chest 12% BSA at base line; 10% BSA on today's exam with improvement noted.   Chronic and persistent condition with duration or expected duration over one year. Condition is improving with treatment but not currently at goal. Decrease in itching with Dupixent injections (samples).  Atopic dermatitis - Severe, on Dupixent (biologic medication).  Atopic dermatitis (eczema) is a chronic, relapsing, pruritic condition that can significantly affect quality of life. It is often associated with allergic rhinitis and/or asthma and can require treatment with topical  medications, phototherapy, or in severe cases biologic medications, which require long term medication management.    Treatment Plan: Continue Dupixent 300 mg/73mL SQ QOW. Patient denies side effects.  Dupixent 300mg /33mL injected SQ into the right upper arm. Patient tolerated injection well.  NDC: 4098-1191-47 Lot: [id number] Exp: 08/2025   Potential side effects include allergic reaction, herpes infections, injection site reactions and conjunctivitis (inflammation of the eyes).  The use of Dupixent requires long term medication management, including periodic office visits.    Long term medication management.  Patient is using long term (months to years) prescription medication  to control their dermatologic condition.  These medications require periodic monitoring to evaluate for efficacy and side effects and may require periodic laboratory monitoring.   Recommend gentle skin care.   Return in about 2 weeks (around 08/21/2023) for Dupixent Injection On Nurse Schedule, dermatitis recheck with Dr. Roseanne Reno in 3 months.  I, Lawson Radar, CMA, am acting as scribe for Willeen Niece, MD.   Documentation: I have reviewed the above documentation for accuracy and completeness, and I agree with the above.  Willeen Niece, MD

## 2023-08-10 DIAGNOSIS — Z4802 Encounter for removal of sutures: Secondary | ICD-10-CM | POA: Diagnosis not present

## 2023-08-10 DIAGNOSIS — W010XXD Fall on same level from slipping, tripping and stumbling without subsequent striking against object, subsequent encounter: Secondary | ICD-10-CM | POA: Diagnosis not present

## 2023-08-10 DIAGNOSIS — S0181XD Laceration without foreign body of other part of head, subsequent encounter: Secondary | ICD-10-CM | POA: Diagnosis not present

## 2023-08-10 DIAGNOSIS — S52502D Unspecified fracture of the lower end of left radius, subsequent encounter for closed fracture with routine healing: Secondary | ICD-10-CM | POA: Diagnosis not present

## 2023-08-21 ENCOUNTER — Ambulatory Visit (INDEPENDENT_AMBULATORY_CARE_PROVIDER_SITE_OTHER)

## 2023-08-21 DIAGNOSIS — L209 Atopic dermatitis, unspecified: Secondary | ICD-10-CM

## 2023-08-21 NOTE — Progress Notes (Signed)
 Patient here for two week Dupixent injection for severe atopic dermatitis.    Dupixent 300mg  syringe injected into right lower abdomen. Patient tolerated well.   LOT: [id number] EXP: 12/20/2024   Dorathy Daft, RMA

## 2023-08-28 DIAGNOSIS — S52502A Unspecified fracture of the lower end of left radius, initial encounter for closed fracture: Secondary | ICD-10-CM | POA: Diagnosis not present

## 2023-08-28 DIAGNOSIS — S52602A Unspecified fracture of lower end of left ulna, initial encounter for closed fracture: Secondary | ICD-10-CM | POA: Diagnosis not present

## 2023-08-29 DIAGNOSIS — M25532 Pain in left wrist: Secondary | ICD-10-CM | POA: Diagnosis not present

## 2023-08-29 DIAGNOSIS — R262 Difficulty in walking, not elsewhere classified: Secondary | ICD-10-CM | POA: Diagnosis not present

## 2023-08-29 DIAGNOSIS — R296 Repeated falls: Secondary | ICD-10-CM | POA: Diagnosis not present

## 2023-08-29 DIAGNOSIS — M84334D Stress fracture, left radius, subsequent encounter for fracture with routine healing: Secondary | ICD-10-CM | POA: Diagnosis not present

## 2023-08-29 DIAGNOSIS — M6281 Muscle weakness (generalized): Secondary | ICD-10-CM | POA: Diagnosis not present

## 2023-08-29 DIAGNOSIS — R278 Other lack of coordination: Secondary | ICD-10-CM | POA: Diagnosis not present

## 2023-08-31 DIAGNOSIS — R278 Other lack of coordination: Secondary | ICD-10-CM | POA: Diagnosis not present

## 2023-08-31 DIAGNOSIS — M25532 Pain in left wrist: Secondary | ICD-10-CM | POA: Diagnosis not present

## 2023-08-31 DIAGNOSIS — M84334D Stress fracture, left radius, subsequent encounter for fracture with routine healing: Secondary | ICD-10-CM | POA: Diagnosis not present

## 2023-08-31 DIAGNOSIS — R296 Repeated falls: Secondary | ICD-10-CM | POA: Diagnosis not present

## 2023-08-31 DIAGNOSIS — M6281 Muscle weakness (generalized): Secondary | ICD-10-CM | POA: Diagnosis not present

## 2023-08-31 DIAGNOSIS — R262 Difficulty in walking, not elsewhere classified: Secondary | ICD-10-CM | POA: Diagnosis not present

## 2023-09-04 ENCOUNTER — Ambulatory Visit

## 2023-09-04 DIAGNOSIS — R262 Difficulty in walking, not elsewhere classified: Secondary | ICD-10-CM | POA: Diagnosis not present

## 2023-09-04 DIAGNOSIS — M6281 Muscle weakness (generalized): Secondary | ICD-10-CM | POA: Diagnosis not present

## 2023-09-04 DIAGNOSIS — L209 Atopic dermatitis, unspecified: Secondary | ICD-10-CM | POA: Diagnosis not present

## 2023-09-04 DIAGNOSIS — R296 Repeated falls: Secondary | ICD-10-CM | POA: Diagnosis not present

## 2023-09-04 DIAGNOSIS — M84334D Stress fracture, left radius, subsequent encounter for fracture with routine healing: Secondary | ICD-10-CM | POA: Diagnosis not present

## 2023-09-04 DIAGNOSIS — R278 Other lack of coordination: Secondary | ICD-10-CM | POA: Diagnosis not present

## 2023-09-04 DIAGNOSIS — M25532 Pain in left wrist: Secondary | ICD-10-CM | POA: Diagnosis not present

## 2023-09-04 NOTE — Progress Notes (Signed)
 Patient here for two week Dupixent injection for severe atopic dermatitis.    Dupixent 300mg  syringe injected into right upper arm. Patient tolerated well.   LOT: [id number] EXP: 08/20/2025   Mara Seminole CMA, AAMA

## 2023-09-06 DIAGNOSIS — M84334D Stress fracture, left radius, subsequent encounter for fracture with routine healing: Secondary | ICD-10-CM | POA: Diagnosis not present

## 2023-09-06 DIAGNOSIS — M25532 Pain in left wrist: Secondary | ICD-10-CM | POA: Diagnosis not present

## 2023-09-06 DIAGNOSIS — R296 Repeated falls: Secondary | ICD-10-CM | POA: Diagnosis not present

## 2023-09-06 DIAGNOSIS — M6281 Muscle weakness (generalized): Secondary | ICD-10-CM | POA: Diagnosis not present

## 2023-09-06 DIAGNOSIS — R262 Difficulty in walking, not elsewhere classified: Secondary | ICD-10-CM | POA: Diagnosis not present

## 2023-09-06 DIAGNOSIS — R278 Other lack of coordination: Secondary | ICD-10-CM | POA: Diagnosis not present

## 2023-09-10 DIAGNOSIS — H353211 Exudative age-related macular degeneration, right eye, with active choroidal neovascularization: Secondary | ICD-10-CM | POA: Diagnosis not present

## 2023-09-13 DIAGNOSIS — R296 Repeated falls: Secondary | ICD-10-CM | POA: Diagnosis not present

## 2023-09-13 DIAGNOSIS — M25532 Pain in left wrist: Secondary | ICD-10-CM | POA: Diagnosis not present

## 2023-09-13 DIAGNOSIS — M6281 Muscle weakness (generalized): Secondary | ICD-10-CM | POA: Diagnosis not present

## 2023-09-13 DIAGNOSIS — R262 Difficulty in walking, not elsewhere classified: Secondary | ICD-10-CM | POA: Diagnosis not present

## 2023-09-13 DIAGNOSIS — R278 Other lack of coordination: Secondary | ICD-10-CM | POA: Diagnosis not present

## 2023-09-13 DIAGNOSIS — M84334D Stress fracture, left radius, subsequent encounter for fracture with routine healing: Secondary | ICD-10-CM | POA: Diagnosis not present

## 2023-09-17 DIAGNOSIS — R262 Difficulty in walking, not elsewhere classified: Secondary | ICD-10-CM | POA: Diagnosis not present

## 2023-09-17 DIAGNOSIS — M84334D Stress fracture, left radius, subsequent encounter for fracture with routine healing: Secondary | ICD-10-CM | POA: Diagnosis not present

## 2023-09-17 DIAGNOSIS — R296 Repeated falls: Secondary | ICD-10-CM | POA: Diagnosis not present

## 2023-09-17 DIAGNOSIS — M6281 Muscle weakness (generalized): Secondary | ICD-10-CM | POA: Diagnosis not present

## 2023-09-17 DIAGNOSIS — R278 Other lack of coordination: Secondary | ICD-10-CM | POA: Diagnosis not present

## 2023-09-17 DIAGNOSIS — M25532 Pain in left wrist: Secondary | ICD-10-CM | POA: Diagnosis not present

## 2023-09-18 ENCOUNTER — Ambulatory Visit (INDEPENDENT_AMBULATORY_CARE_PROVIDER_SITE_OTHER)

## 2023-09-18 DIAGNOSIS — S52602D Unspecified fracture of lower end of left ulna, subsequent encounter for closed fracture with routine healing: Secondary | ICD-10-CM | POA: Diagnosis not present

## 2023-09-18 DIAGNOSIS — L209 Atopic dermatitis, unspecified: Secondary | ICD-10-CM

## 2023-09-18 DIAGNOSIS — S52502D Unspecified fracture of the lower end of left radius, subsequent encounter for closed fracture with routine healing: Secondary | ICD-10-CM | POA: Diagnosis not present

## 2023-09-18 NOTE — Progress Notes (Signed)
 Patient here for two week Dupixent  injection for severe atopic dermatitis.    Dupixent  300mg  syringe injected into left upper arm. Patient tolerated well.   LOT: [id number] EXP: 09/2025   Pamela Moore, CMA

## 2023-09-20 DIAGNOSIS — R262 Difficulty in walking, not elsewhere classified: Secondary | ICD-10-CM | POA: Diagnosis not present

## 2023-09-20 DIAGNOSIS — R296 Repeated falls: Secondary | ICD-10-CM | POA: Diagnosis not present

## 2023-09-20 DIAGNOSIS — M84334D Stress fracture, left radius, subsequent encounter for fracture with routine healing: Secondary | ICD-10-CM | POA: Diagnosis not present

## 2023-09-20 DIAGNOSIS — R278 Other lack of coordination: Secondary | ICD-10-CM | POA: Diagnosis not present

## 2023-09-20 DIAGNOSIS — M6281 Muscle weakness (generalized): Secondary | ICD-10-CM | POA: Diagnosis not present

## 2023-09-20 DIAGNOSIS — M25532 Pain in left wrist: Secondary | ICD-10-CM | POA: Diagnosis not present

## 2023-09-28 DIAGNOSIS — R262 Difficulty in walking, not elsewhere classified: Secondary | ICD-10-CM | POA: Diagnosis not present

## 2023-09-28 DIAGNOSIS — R278 Other lack of coordination: Secondary | ICD-10-CM | POA: Diagnosis not present

## 2023-09-28 DIAGNOSIS — M6281 Muscle weakness (generalized): Secondary | ICD-10-CM | POA: Diagnosis not present

## 2023-09-28 DIAGNOSIS — R296 Repeated falls: Secondary | ICD-10-CM | POA: Diagnosis not present

## 2023-09-28 DIAGNOSIS — M84334D Stress fracture, left radius, subsequent encounter for fracture with routine healing: Secondary | ICD-10-CM | POA: Diagnosis not present

## 2023-09-28 DIAGNOSIS — M25532 Pain in left wrist: Secondary | ICD-10-CM | POA: Diagnosis not present

## 2023-10-02 ENCOUNTER — Ambulatory Visit

## 2023-10-02 DIAGNOSIS — L2081 Atopic neurodermatitis: Secondary | ICD-10-CM | POA: Diagnosis not present

## 2023-10-02 NOTE — Progress Notes (Signed)
 Patient here for two week Dupixent  injection for severe atopic dermatitis.    Dupixent  300mg  syringe injected into right upper arm. Patient tolerated well.   LOT: [id number] EXP: 09/2025  Lisbeth Rides, RMA

## 2023-10-03 DIAGNOSIS — F0280 Dementia in other diseases classified elsewhere without behavioral disturbance: Secondary | ICD-10-CM | POA: Diagnosis not present

## 2023-10-03 DIAGNOSIS — E782 Mixed hyperlipidemia: Secondary | ICD-10-CM | POA: Diagnosis not present

## 2023-10-03 DIAGNOSIS — E538 Deficiency of other specified B group vitamins: Secondary | ICD-10-CM | POA: Diagnosis not present

## 2023-10-03 DIAGNOSIS — G309 Alzheimer's disease, unspecified: Secondary | ICD-10-CM | POA: Diagnosis not present

## 2023-10-03 DIAGNOSIS — Z0000 Encounter for general adult medical examination without abnormal findings: Secondary | ICD-10-CM | POA: Diagnosis not present

## 2023-10-16 ENCOUNTER — Ambulatory Visit

## 2023-10-16 DIAGNOSIS — L209 Atopic dermatitis, unspecified: Secondary | ICD-10-CM

## 2023-10-16 NOTE — Progress Notes (Signed)
 Patient here for two week Dupixent  injection for severe atopic dermatitis.    Dupixent  300mg  syringe injected into left upper arm. Patient tolerated well.   LOT: [id number] EXP: 08/2025   Mara Seminole CMA, AAMA

## 2023-10-25 DIAGNOSIS — S52602D Unspecified fracture of lower end of left ulna, subsequent encounter for closed fracture with routine healing: Secondary | ICD-10-CM | POA: Diagnosis not present

## 2023-10-25 DIAGNOSIS — S52502D Unspecified fracture of the lower end of left radius, subsequent encounter for closed fracture with routine healing: Secondary | ICD-10-CM | POA: Diagnosis not present

## 2023-10-29 DIAGNOSIS — H353211 Exudative age-related macular degeneration, right eye, with active choroidal neovascularization: Secondary | ICD-10-CM | POA: Diagnosis not present

## 2023-10-30 ENCOUNTER — Ambulatory Visit

## 2023-10-30 DIAGNOSIS — L209 Atopic dermatitis, unspecified: Secondary | ICD-10-CM | POA: Diagnosis not present

## 2023-10-30 NOTE — Progress Notes (Signed)
 Patient here for two week Dupixent  injection for severe atopic dermatitis.    Dupixent  300mg  syringe injected into right upper arm. Patient tolerated well.   LOT: [id number] EXP: 08/2025   Lisbeth Rides, RMA

## 2023-11-12 ENCOUNTER — Ambulatory Visit (INDEPENDENT_AMBULATORY_CARE_PROVIDER_SITE_OTHER): Admitting: Dermatology

## 2023-11-12 DIAGNOSIS — Z7189 Other specified counseling: Secondary | ICD-10-CM

## 2023-11-12 DIAGNOSIS — Z79899 Other long term (current) drug therapy: Secondary | ICD-10-CM

## 2023-11-12 DIAGNOSIS — L2081 Atopic neurodermatitis: Secondary | ICD-10-CM

## 2023-11-12 MED ORDER — DUPILUMAB 300 MG/2ML ~~LOC~~ SOSY
300.0000 mg | PREFILLED_SYRINGE | Freq: Once | SUBCUTANEOUS | Status: AC
  Administered 2023-11-12: 300 mg via SUBCUTANEOUS

## 2023-11-12 NOTE — Progress Notes (Signed)
   Follow-Up Visit   Subjective  Pamela Moore is a 89 y.o. female who presents for the following: Atopic Neurodermatitis  Patient is doing well on Dupixent  injections (started 07/03/2023) and not having any flares. No side effects.   The following portions of the chart were reviewed this encounter and updated as appropriate: medications, allergies, medical history  Review of Systems:  No other skin or systemic complaints except as noted in HPI or Assessment and Plan.  Objective  Well appearing patient in no apparent distress; mood and affect are within normal limits.  Areas Examined: Face, arms, trunk  Relevant physical exam findings are noted in the Assessment and Plan.    Assessment & Plan   ATOPIC NEURODERMATITIS   Related Medications dupilumab  (DUPIXENT ) prefilled syringe 300 mg  dupilumab  (DUPIXENT ) prefilled syringe 300 mg    ATOPIC NEURODERMATITIS Exam: Skin clear today- pt denies itching <1% BSA  Chronic condition with duration or expected duration over one year. Currently well-controlled on Dupixent  injections.   Atopic dermatitis - Severe, on Dupixent  (biologic medication).  Atopic dermatitis (eczema) is a chronic, relapsing, pruritic condition that can significantly affect quality of life. It is often associated with allergic rhinitis and/or asthma and can require treatment with topical medications, phototherapy, or in severe cases biologic medications, which require long term medication management.    Treatment Plan:  Continue Dupixent  300 mg/2mL SQ QOW. Patient denies side effects. Pt currently getting samples. It appears that her insurance has approved it, but we don't have copay information.  Will f/up with [facility] specialty pharmacy to get more information  Dupixent  300mg /75mL injected SQ into the left upper arm. Patient tolerated injection well.  [facility] [id number] Exp 08/2025   Potential side effects include allergic reaction, herpes  infections, injection site reactions and conjunctivitis (inflammation of the eyes).  The use of Dupixent  requires long term medication management, including periodic office visits.    Long term medication management.  Patient is using long term (months to years) prescription medication  to control their dermatologic condition.  These medications require periodic monitoring to evaluate for efficacy and side effects and may require periodic laboratory monitoring.   Recommend gentle skin care.   Return in about 6 months (around 05/13/2024) for Atopic Dermatitis with Dr Jackquline. Patient will see nurse q2wks for Dupixent  inj until improved. SABRA LILLETTE Andrea Ezzard, CMA, am acting as scribe for Rexene Jackquline, MD .   Documentation: I have reviewed the above documentation for accuracy and completeness, and I agree with the above.  Rexene Jackquline, MD

## 2023-11-12 NOTE — Patient Instructions (Signed)

## 2023-11-27 ENCOUNTER — Ambulatory Visit

## 2023-11-27 DIAGNOSIS — L209 Atopic dermatitis, unspecified: Secondary | ICD-10-CM

## 2023-11-27 NOTE — Progress Notes (Signed)
 Patient here for two week Dupixent  injection for severe atopic dermatitis.    Dupixent  300mg  syringe injected into right upper arm. Patient tolerated well.   LOT: [id number] EXP: 08/2025   Lisbeth Rides, RMA

## 2023-12-11 ENCOUNTER — Ambulatory Visit

## 2023-12-11 ENCOUNTER — Telehealth: Payer: Self-pay

## 2023-12-11 DIAGNOSIS — L209 Atopic dermatitis, unspecified: Secondary | ICD-10-CM | POA: Diagnosis not present

## 2023-12-11 NOTE — Telephone Encounter (Signed)
 Patient came in today for Dupixent  injection. She is questioning how long does she need to continue this? She also did not remember seeing you one month ago. She states she is improved and fine with her skin at this time.   I know you documented wanting her to continue until improved. She did not schedule two week follow up. Advised patient I would call. aw

## 2023-12-11 NOTE — Progress Notes (Signed)
 Patient here for two week Dupixent  injection for severe atopic dermatitis.    Dupixent  300mg  syringe injected into left upper arm. Patient tolerated well.   LOT: [id number] EXP: 09/2025   Alan Pizza, RMA

## 2023-12-11 NOTE — Telephone Encounter (Signed)
Patient advised of information per Dr. Stewart. aw 

## 2024-01-14 DIAGNOSIS — H18519 Endothelial corneal dystrophy, unspecified eye: Secondary | ICD-10-CM | POA: Diagnosis not present

## 2024-01-14 DIAGNOSIS — Z961 Presence of intraocular lens: Secondary | ICD-10-CM | POA: Diagnosis not present

## 2024-01-14 DIAGNOSIS — H353211 Exudative age-related macular degeneration, right eye, with active choroidal neovascularization: Secondary | ICD-10-CM | POA: Diagnosis not present

## 2024-01-14 DIAGNOSIS — H353122 Nonexudative age-related macular degeneration, left eye, intermediate dry stage: Secondary | ICD-10-CM | POA: Diagnosis not present

## 2024-01-31 DIAGNOSIS — M25561 Pain in right knee: Secondary | ICD-10-CM | POA: Diagnosis not present

## 2024-01-31 DIAGNOSIS — M1711 Unilateral primary osteoarthritis, right knee: Secondary | ICD-10-CM | POA: Diagnosis not present

## 2024-04-14 DIAGNOSIS — H18513 Endothelial corneal dystrophy, bilateral: Secondary | ICD-10-CM | POA: Diagnosis not present

## 2024-04-14 DIAGNOSIS — H04123 Dry eye syndrome of bilateral lacrimal glands: Secondary | ICD-10-CM | POA: Diagnosis not present

## 2024-04-14 DIAGNOSIS — H353122 Nonexudative age-related macular degeneration, left eye, intermediate dry stage: Secondary | ICD-10-CM | POA: Diagnosis not present

## 2024-04-14 DIAGNOSIS — H353211 Exudative age-related macular degeneration, right eye, with active choroidal neovascularization: Secondary | ICD-10-CM | POA: Diagnosis not present

## 2024-04-15 DIAGNOSIS — Z0000 Encounter for general adult medical examination without abnormal findings: Secondary | ICD-10-CM | POA: Diagnosis not present

## 2024-04-22 DIAGNOSIS — Z79899 Other long term (current) drug therapy: Secondary | ICD-10-CM | POA: Diagnosis not present

## 2024-04-22 DIAGNOSIS — Z0000 Encounter for general adult medical examination without abnormal findings: Secondary | ICD-10-CM | POA: Diagnosis not present

## 2024-04-22 DIAGNOSIS — Z1331 Encounter for screening for depression: Secondary | ICD-10-CM | POA: Diagnosis not present

## 2024-04-22 DIAGNOSIS — E039 Hypothyroidism, unspecified: Secondary | ICD-10-CM | POA: Diagnosis not present

## 2024-04-22 DIAGNOSIS — E538 Deficiency of other specified B group vitamins: Secondary | ICD-10-CM | POA: Diagnosis not present

## 2024-05-06 ENCOUNTER — Ambulatory Visit: Admitting: Dermatology

## 2024-05-06 ENCOUNTER — Encounter: Payer: Self-pay | Admitting: Dermatology

## 2024-05-06 DIAGNOSIS — L2081 Atopic neurodermatitis: Secondary | ICD-10-CM

## 2024-05-06 MED ORDER — MOMETASONE FUROATE 0.1 % EX CREA: TOPICAL_CREAM | CUTANEOUS | 2 refills | Status: AC

## 2024-05-06 NOTE — Progress Notes (Signed)
° °  Follow-Up Visit   Subjective  Pamela Moore is a 89 y.o. female who presents for the following: Atopic Dermatitis  Was on Dupixent , but stopped it.  Gets itching off and on on chest, but not like it was before.  The following portions of the chart were reviewed this encounter and updated as appropriate: medications, allergies, medical history  Review of Systems:  No other skin or systemic complaints except as noted in HPI or Assessment and Plan.  Objective  Well appearing patient in no apparent distress; mood and affect are within normal limits.  Areas Examined: Face, arms, chest  Relevant physical exam findings are noted in the Assessment and Plan.    Assessment & Plan      ATOPIC DERMATITIS Exam: few pink excoriated papules at upper chest. 1% BSA  Chronic condition with duration or expected duration over one year. Currently well-controlled.   Atopic dermatitis (eczema) is a chronic, relapsing, pruritic condition that can significantly affect quality of life. It is often associated with allergic rhinitis and/or asthma and can require treatment with topical medications, phototherapy, or in severe cases biologic injectable medication (Dupixent ; Adbry) or Oral JAK inhibitors.  Treatment Plan:  Start Mometasone  cream once or twice daily to affected body areas as needed for itching. Avoid applying to face, groin, and axilla. Use as directed. Long-term use can cause thinning of the skin.  Topical steroids (such as triamcinolone , fluocinolone, fluocinonide, mometasone , clobetasol, halobetasol, betamethasone, hydrocortisone) can cause thinning and lightening of the skin if they are used for too long in the same area. Your physician has selected the right strength medicine for your problem and area affected on the body. Please use your medication only as directed by your physician to prevent side effects.    Recommend gentle skin care.   Return if symptoms worsen or fail to  improve.  I, Jill Parcell, CMA, am acting as scribe for Rexene Rattler, MD.   Documentation: I have reviewed the above documentation for accuracy and completeness, and I agree with the above.  Rexene Rattler, MD

## 2024-05-06 NOTE — Patient Instructions (Signed)
 Start Mometasone  cream once or twice daily to affected body areas as needed for itching. Avoid applying to face, groin, and axilla. Use as directed. Long-term use can cause thinning of the skin.  Topical steroids (such as triamcinolone , fluocinolone, fluocinonide, mometasone , clobetasol, halobetasol, betamethasone, hydrocortisone) can cause thinning and lightening of the skin if they are used for too long in the same area. Your physician has selected the right strength medicine for your problem and area affected on the body. Please use your medication only as directed by your physician to prevent side effects.      Recommend using CeraVe Anti-itch lotion or cream twice daily as needed for itching.      Gentle Skin Care Guide  1. Bathe no more than once a day.  2. Avoid bathing in hot water   3. Use a mild soap like Dove, Vanicream, Cetaphil, CeraVe. Can use Lever 2000 or Cetaphil antibacterial soap  4. Use soap only where you need it. On most days, use it under your arms, between your legs, and on your feet. Let the water  rinse other areas unless visibly dirty.  5. When you get out of the bath/shower, use a towel to gently blot your skin dry, don't rub it.  6. While your skin is still a little damp, apply a moisturizing cream such as Vanicream, CeraVe, Cetaphil, Eucerin, Sarna lotion or plain Vaseline Jelly. For hands apply Neutrogena Norwegian Hand Cream or Excipial Hand Cream.  7. Reapply moisturizer any time you start to itch or feel dry.  8. Sometimes using free and clear laundry detergents can be helpful. Fabric softener sheets should be avoided. Downy Free & Gentle liquid, or any liquid fabric softener that is free of dyes and perfumes, it acceptable to use  9. If your doctor has given you prescription creams you may apply moisturizers over them       Due to recent changes in healthcare laws, you may see results of your pathology and/or laboratory studies on MyChart before the  doctors have had a chance to review them. We understand that in some cases there may be results that are confusing or concerning to you. Please understand that not all results are received at the same time and often the doctors may need to interpret multiple results in order to provide you with the best plan of care or course of treatment. Therefore, we ask that you please give us  2 business days to thoroughly review all your results before contacting the office for clarification. Should we see a critical lab result, you will be contacted sooner.   If You Need Anything After Your Visit  If you have any questions or concerns for your doctor, please call our main line at [phone] and press option 4 to reach your doctor's medical assistant. If no one answers, please leave a voicemail as directed and we will return your call as soon as possible. Messages left after 4 pm will be answered the following business day.   You may also send us  a message via MyChart. We typically respond to MyChart messages within 1-2 business days.  For prescription refills, please ask your pharmacy to contact our office. Our fax number is [phone].  If you have an urgent issue when the clinic is closed that cannot wait until the next business day, you can page your doctor at the number below.    Please note that while we do our best to be available for urgent issues outside of office hours, we  are not available 24/7.   If you have an urgent issue and are unable to reach us , you may choose to seek medical care at your doctor's office, retail clinic, urgent care center, or emergency room.  If you have a medical emergency, please immediately call 911 or go to the emergency department.  Pager Numbers  - Dr. Hester: [phone]  - Dr. Jackquline: [phone]  - Dr. Claudene: [phone]   - Dr. Raymund: [phone]  In the event of inclement weather, please call our main line at [phone] for an update on the  status of any delays or closures.  Dermatology Medication Tips: Please keep the boxes that topical medications come in in order to help keep track of the instructions about where and how to use these. Pharmacies typically print the medication instructions only on the boxes and not directly on the medication tubes.   If your medication is too expensive, please contact our office at [phone] option 4 or send us  a message through MyChart.   We are unable to tell what your co-pay for medications will be in advance as this is different depending on your insurance coverage. However, we may be able to find a substitute medication at lower cost or fill out paperwork to get insurance to cover a needed medication.   If a prior authorization is required to get your medication covered by your insurance company, please allow us  1-2 business days to complete this process.  Drug prices often vary depending on where the prescription is filled and some pharmacies may offer cheaper prices.  The website www.goodrx.com contains coupons for medications through different pharmacies. The prices here do not account for what the cost may be with help from insurance (it may be cheaper with your insurance), but the website can give you the price if you did not use any insurance.  - You can print the associated coupon and take it with your prescription to the pharmacy.  - You may also stop by our office during regular business hours and pick up a GoodRx coupon card.  - If you need your prescription sent electronically to a different pharmacy, notify our office through [facility] or by phone at [phone] option 4.     Si Usted Necesita Algo Despus de Su Visita  Tambin puede enviarnos un mensaje a travs de Clinical Cytogeneticist. Por lo general respondemos a los mensajes de MyChart en el transcurso de 1 a 2 das hbiles.  Para renovar recetas, por favor pida a su farmacia que se ponga en contacto con nuestra oficina.  [place] de fax es [place] [phone].  Si tiene un asunto urgente cuando la clnica est cerrada y que no puede esperar hasta el siguiente da hbil, puede llamar/localizar a su doctor(a) al nmero que aparece a continuacin.   Por favor, tenga en cuenta que aunque hacemos todo lo posible para estar disponibles para asuntos urgentes fuera del horario de [place], no estamos disponibles las 24 horas del da, los 7 [address]  [address] de la [place].   Si tiene un problema urgente y no puede comunicarse con nosotros, puede optar por buscar atencin mdica  en el consultorio de su doctor(a), en una clnica privada, en un centro de atencin urgente o en una sala de emergencias.  Si tiene engineer, drilling, por favor llame inmediatamente al 911 o vaya a la sala de emergencias.  Nmeros de bper  - Dr. Hester: [phone]  - Dra. Jackquline: [phone]  - Dr. Claudene: [phone]  -  Dra. Raymund: [phone]  En caso de inclemencias del tiempo, por favor llame a nuestra lnea principal al [phone] para una actualizacin sobre el [place] de cualquier retraso o cierre.  Consejos para la medicacin en dermatologa: Por favor, guarde las cajas en las que vienen los medicamentos de uso tpico para ayudarle a seguir las instrucciones sobre dnde y cmo usarlos. Las farmacias generalmente imprimen las instrucciones del medicamento slo en las cajas y no directamente en los tubos del [place].   Si su medicamento es muy caro, por favor, pngase en contacto con landry rieger llamando al [phone] y presione la opcin 4 o envenos un mensaje a travs de Clinical Cytogeneticist.   No podemos decirle cul ser su copago por los medicamentos por adelantado ya que esto es diferente dependiendo de la cobertura de su seguro. Sin embargo, es posible que podamos encontrar un medicamento sustituto a audiological scientist un formulario para que el seguro cubra el medicamento que se considera necesario.   Si se requiere una  autorizacin previa para que su compaa de seguros cubra su medicamento, por favor permtanos de 1 a 2 das hbiles para completar este proceso.  Los precios de los medicamentos varan con frecuencia dependiendo del environmental consultant de dnde se surte la receta y alguna farmacias pueden ofrecer precios ms baratos.  El sitio web www.goodrx.com tiene cupones para medicamentos de health and safety inspector. Los precios aqu no tienen en cuenta lo que podra costar con la ayuda del seguro (puede ser ms barato con su seguro), pero el sitio web puede darle el precio si no utiliz tourist information centre manager.  - Puede imprimir el cupn correspondiente y llevarlo con su receta a la farmacia.  - Tambin puede pasar por nuestra oficina durante el horario de atencin regular y education officer, museum una tarjeta de cupones de GoodRx.  - Si necesita que su receta se enve electrnicamente a una farmacia diferente, informe a nuestra oficina a travs de MyChart de [place] o por telfono llamando al [phone] y presione la opcin 4.
